# Patient Record
Sex: Female | Born: 1975 | Race: White | Hispanic: No | Marital: Married | Smoking: Former smoker
Health system: Southern US, Community
[De-identification: ages and names within clinical notes are randomized; demographics above are authoritative.]

## PROBLEM LIST (undated history)

## (undated) DIAGNOSIS — K219 Gastro-esophageal reflux disease without esophagitis: Secondary | ICD-10-CM

## (undated) DIAGNOSIS — F909 Attention-deficit hyperactivity disorder, unspecified type: Secondary | ICD-10-CM

## (undated) DIAGNOSIS — F319 Bipolar disorder, unspecified: Secondary | ICD-10-CM

## (undated) HISTORY — PX: EYE SURGERY: SHX253

---

## 2013-11-07 ENCOUNTER — Emergency Department (HOSPITAL_COMMUNITY)
Admission: EM | Admit: 2013-11-07 | Discharge: 2013-11-07 | Disposition: A | Discharge status: Home / Self Care | Payer: Self-pay | Attending: Emergency Medicine | Admitting: Emergency Medicine

## 2013-11-07 ENCOUNTER — Encounter (HOSPITAL_COMMUNITY): Payer: Self-pay | Admitting: Emergency Medicine

## 2013-11-07 DIAGNOSIS — F909 Attention-deficit hyperactivity disorder, unspecified type: Secondary | ICD-10-CM | POA: Insufficient documentation

## 2013-11-07 DIAGNOSIS — Z79899 Other long term (current) drug therapy: Secondary | ICD-10-CM | POA: Insufficient documentation

## 2013-11-07 DIAGNOSIS — Y9389 Activity, other specified: Secondary | ICD-10-CM | POA: Insufficient documentation

## 2013-11-07 DIAGNOSIS — Y929 Unspecified place or not applicable: Secondary | ICD-10-CM | POA: Insufficient documentation

## 2013-11-07 DIAGNOSIS — F319 Bipolar disorder, unspecified: Secondary | ICD-10-CM | POA: Insufficient documentation

## 2013-11-07 DIAGNOSIS — S61419A Laceration without foreign body of unspecified hand, initial encounter: Secondary | ICD-10-CM

## 2013-11-07 DIAGNOSIS — W260XXA Contact with knife, initial encounter: Secondary | ICD-10-CM | POA: Insufficient documentation

## 2013-11-07 DIAGNOSIS — S61209A Unspecified open wound of unspecified finger without damage to nail, initial encounter: Secondary | ICD-10-CM | POA: Insufficient documentation

## 2013-11-07 DIAGNOSIS — W261XXA Contact with sword or dagger, initial encounter: Secondary | ICD-10-CM

## 2013-11-07 HISTORY — DX: Attention-deficit hyperactivity disorder, unspecified type: F90.9

## 2013-11-07 HISTORY — DX: Bipolar disorder, unspecified: F31.9

## 2013-11-07 NOTE — ED Provider Notes (Signed)
CSN: [id number]     Arrival date & time 11/07/13  1028 History  This chart was scribed for non-physician practitioner, Emilia Beck, PA-C working with Gavin Pound. Oletta Lamas, MD by Greggory Stallion, ED scribe. This patient was seen in room TR07C/TR07C and the patient's care was started at 11:46 AM.    Chief Complaint  Patient presents with  . Extremity Laceration   The history is provided by the patient. No language interpreter was used.   HPI Comments: Crystal Gordon is a 38 y.o. female who presents to the Emergency Department complaining of a laceration to her right index finger that occurred about two hours ago. Pt was washing a glass when it broke in her hand. Her last tetanus was about 3 years ago.   Past Medical History  Diagnosis Date  . Bipolar 1 disorder   . ADHD (attention deficit hyperactivity disorder)    History reviewed. No pertinent past surgical history. No family history on file. History  Substance Use Topics  . Smoking status: Never Smoker   . Smokeless tobacco: Not on file  . Alcohol Use: No   OB History   Grav Para Term Preterm Abortions TAB SAB Ect Mult Living                 Review of Systems  Skin: Positive for wound.  All other systems reviewed and are negative.   Allergies  Review of patient's allergies indicates no known allergies.  Home Medications   Current Outpatient Rx  Name  Route  Sig  Dispense  Refill  . diphenhydrAMINE (BENADRYL) 25 mg capsule   Oral   Take 25 mg by mouth at bedtime as needed for allergies or sleep.         . methylphenidate (RITALIN) 5 MG tablet   Oral   Take 5 mg by mouth 2 (two) times daily.         . ziprasidone (GEODON) 20 MG capsule   Oral   Take 20 mg by mouth 2 (two) times daily with a meal.          BP 127/72  Pulse 83  Temp(Src) 98.1 F (36.7 C) (Oral)  SpO2 100%  Physical Exam  Nursing note and vitals reviewed. Constitutional: She is oriented to person, place, and time. She appears  well-developed and well-nourished. No distress.  HENT:  Head: Normocephalic and atraumatic.  Eyes: EOM are normal.  Neck: Neck supple. No tracheal deviation present.  Cardiovascular: Normal rate.   Sufficient capillary refill of distal extremities.   Pulmonary/Chest: Effort normal. No respiratory distress.  Musculoskeletal: Normal range of motion.  Limited ROM of right index finger due to pain. No obvious deformity.   Neurological: She is alert and oriented to person, place, and time.  No numbness distal to laceration.   Skin: Skin is warm and dry.  2.5 cm v-shaped laceration to right index finger. Bleeding controlled.   Psychiatric: She has a normal mood and affect. Her behavior is normal.    ED Course  Procedures (including critical care time)  DIAGNOSTIC STUDIES: Oxygen Saturation is 100% on RA, normal by my interpretation.    COORDINATION OF CARE: 11:50 AM-Discussed treatment plan which includes laceration repair with pt at bedside and pt agreed to plan.   LACERATION REPAIR PROCEDURE NOTE The patient's identification was confirmed and consent was obtained. This procedure was performed by Emilia Beck, PA-C at 11:50 AM. Site: right index finger Sterile procedures observed Anesthetic used (type and amt): 2 mL  2% lidocaine without epi Suture type/size: 4-0 prolene Length: 2.5 cm, v-shaped # of Sutures: 3 Technique: simple interrupted Complexity: simple Antibx ointment applied Tetanus UTD Site anesthetized, irrigated with NS, explored without evidence of foreign body, wound well approximated, site covered with dry, sterile dressing.  Patient tolerated procedure well without complications. Instructions for care discussed verbally and patient provided with additional written instructions for homecare and f/u.  Labs Review Labs Reviewed - No data to display Imaging Review No results found.  EKG Interpretation   None       MDM   Final diagnoses:  Hand  laceration    12:04 PM Laceration repaired without difficulty. No neurovascular compromise. Patient instructed to return in 10 days for suture removal.   I personally performed the services described in this documentation, which was scribed in my presence. The recorded information has been reviewed and is accurate.   Emilia BeckKaitlyn Alexina Niccoli, PA-C 11/07/13 1206

## 2013-11-07 NOTE — Discharge Instructions (Signed)
Keep wound clean. Return to the ED in 10 days for suture removal. Refer to attached documents for more information.  °

## 2013-11-07 NOTE — ED Notes (Signed)
Unable to locate pt x1

## 2013-11-07 NOTE — ED Notes (Signed)
Laceration to right index finger from broken drinking glass which pt was washing. 1 in. Lac , bleeding controlled.

## 2013-11-11 NOTE — ED Provider Notes (Signed)
Medical screening examination/treatment/procedure(s) were performed by non-physician practitioner and as supervising physician I was immediately available for consultation/collaboration.   Gavin PoundMichael Y. Travor Royce, MD 11/11/13 1525

## 2015-01-09 ENCOUNTER — Encounter (HOSPITAL_COMMUNITY): Payer: Self-pay | Admitting: Emergency Medicine

## 2015-01-09 ENCOUNTER — Emergency Department (INDEPENDENT_AMBULATORY_CARE_PROVIDER_SITE_OTHER)
Admission: EM | Admit: 2015-01-09 | Discharge: 2015-01-09 | Disposition: A | Discharge status: Home / Self Care | Payer: Self-pay | Source: Home / Self Care | Attending: Emergency Medicine | Admitting: Emergency Medicine

## 2015-01-09 DIAGNOSIS — R3915 Urgency of urination: Secondary | ICD-10-CM

## 2015-01-09 DIAGNOSIS — R3129 Other microscopic hematuria: Secondary | ICD-10-CM

## 2015-01-09 DIAGNOSIS — R312 Other microscopic hematuria: Secondary | ICD-10-CM

## 2015-01-09 LAB — POCT URINALYSIS DIP (DEVICE)
Bilirubin Urine: NEGATIVE
Glucose, UA: NEGATIVE mg/dL
Ketones, ur: NEGATIVE mg/dL
LEUKOCYTES UA: NEGATIVE
NITRITE: NEGATIVE
Protein, ur: NEGATIVE mg/dL
Specific Gravity, Urine: >=1.03 (ref 1.005–1.030)
Urobilinogen, UA: 0.2 mg/dL (ref 0.0–1.0)
pH: 6 (ref 5.0–8.0)

## 2015-01-09 LAB — POCT PREGNANCY, URINE: Preg Test, Ur: NEGATIVE

## 2015-01-09 MED ORDER — TAMSULOSIN HCL 0.4 MG PO CAPS: 0.4000 mg | ORAL_CAPSULE | Freq: Every day | ORAL | Status: DC

## 2015-01-09 NOTE — ED Provider Notes (Signed)
CSN: [id number]     Arrival date & time 01/09/15  1332 History   First MD Initiated Contact with Patient 01/09/15 1522     Chief Complaint  Patient presents with  . Urinary Tract Infection   (Consider location/radiation/quality/duration/timing/severity/associated sxs/prior Treatment) HPI She is a 39 year old woman here for evaluation of urinary symptoms. She states for the last year she has had intermittent right side pain. This typically occurs first thing in the morning with urination. She also reports a pressure sensation when urinating. She denies frank dysuria. She reports some urgency and frequency. No gross hematuria. No fevers or chills. No nausea. No flank pain. She also reports feeling tired all the time, but thinks this may be related to her psych medications.  Past Medical History  Diagnosis Date  . Bipolar 1 disorder   . ADHD (attention deficit hyperactivity disorder)    History reviewed. No pertinent past surgical history. History reviewed. No pertinent family history. History  Substance Use Topics  . Smoking status: Never Smoker   . Smokeless tobacco: Not on file  . Alcohol Use: No   OB History    No data available     Review of Systems  Constitutional: Negative for fever.  Gastrointestinal: Positive for abdominal pain (right side).  Genitourinary: Positive for urgency and frequency. Negative for dysuria, flank pain and vaginal bleeding.    Allergies  Review of patient's allergies indicates no known allergies.  Home Medications   Prior to Admission medications   Medication Sig Start Date End Date Taking? Authorizing Provider  citalopram (CELEXA) 20 MG tablet Take 20 mg by mouth daily.   Yes Historical Provider, MD  Lurasidone HCl (LATUDA PO) Take by mouth.   Yes Historical Provider, MD  diphenhydrAMINE (BENADRYL) 25 mg capsule Take 25 mg by mouth at bedtime as needed for allergies or sleep.    Historical Provider, MD  methylphenidate (RITALIN) 5 MG tablet  Take 5 mg by mouth 2 (two) times daily.    Historical Provider, MD  tamsulosin (FLOMAX) 0.4 MG CAPS capsule Take 1 capsule (0.4 mg total) by mouth daily. 01/09/15   Charm RingsErin J Kerolos Nehme, MD  ziprasidone (GEODON) 20 MG capsule Take 20 mg by mouth 2 (two) times daily with a meal.    Historical Provider, MD   BP 105/69 mmHg  Pulse 75  Temp(Src) 98.1 F (36.7 C) (Oral)  Resp 16  SpO2 97%  LMP 12/18/2014 Physical Exam  Constitutional: She is oriented to person, place, and time. She appears well-developed and well-nourished. No distress.  Neck: Neck supple.  Cardiovascular: Normal rate.   Pulmonary/Chest: Effort normal.  Abdominal: Soft. She exhibits no distension. There is no tenderness. There is no rebound and no guarding.  No CVA tenderness  Neurological: She is alert and oriented to person, place, and time.    ED Course  Procedures (including critical care time) Labs Review Labs Reviewed  POCT URINALYSIS DIP (DEVICE) - Abnormal; Notable for the following:    Hgb urine dipstick TRACE (*)    All other components within normal limits  URINE CULTURE  POCT PREGNANCY, URINE    Imaging Review No results found.   MDM   1. Urinary urgency   2. Microscopic hematuria    UA shows microscopic hematuria. This in combination with her right side pain, raises some suspicion for a kidney stone. This would be an unusual presentation, as her symptoms have been present for a year. She may also be having some bladder spasms. We'll treat  with a 2 week course of Flomax and Kegel exercises. Follow-up as needed.    Charm Rings, MD 01/09/15 [phone]

## 2015-01-09 NOTE — ED Notes (Signed)
C/o  Pressure.  Incontinence.  Urinary frequency.   Pt states feels like its been going on for the past year.  Pt has increased water and cranberry juice.  Denies fever, n/v/d.

## 2015-01-09 NOTE — Discharge Instructions (Signed)
There are no signs of infection in your urine. There is a small amount of blood in your urine. With the blood in your urine and those pains you have, I am concerned for a small kidney stone. Take Flomax daily for 2 weeks. Work on Kegel exercises for the urinary urgency. If there is no improvement in 2 weeks, please come back. If you develop severe pain, fevers, bloody urine, please go to the emergency room.

## 2015-01-11 LAB — URINE CULTURE
COLONY COUNT: NO GROWTH
Culture: NO GROWTH

## 2016-05-11 ENCOUNTER — Emergency Department
Admission: EM | Admit: 2016-05-11 | Discharge: 2016-05-11 | Disposition: A | Discharge status: Home / Self Care | Payer: Self-pay | Attending: Emergency Medicine | Admitting: Emergency Medicine

## 2016-05-11 ENCOUNTER — Encounter: Payer: Self-pay | Admitting: Emergency Medicine

## 2016-05-11 ENCOUNTER — Emergency Department: Payer: Self-pay

## 2016-05-11 DIAGNOSIS — J9811 Atelectasis: Secondary | ICD-10-CM | POA: Insufficient documentation

## 2016-05-11 DIAGNOSIS — F909 Attention-deficit hyperactivity disorder, unspecified type: Secondary | ICD-10-CM | POA: Insufficient documentation

## 2016-05-11 DIAGNOSIS — M545 Low back pain: Secondary | ICD-10-CM | POA: Insufficient documentation

## 2016-05-11 HISTORY — DX: Gastro-esophageal reflux disease without esophagitis: K21.9

## 2016-05-11 LAB — CBC WITH DIFFERENTIAL/PLATELET
BASOS ABS: 0.1 10*3/uL (ref 0–0.1)
Basophils Relative: 1 %
EOS PCT: 3 %
Eosinophils Absolute: 0.7 10*3/uL (ref 0–0.7)
HEMATOCRIT: 33.4 % — AB (ref 35.0–47.0)
Hemoglobin: 10.8 g/dL — ABNORMAL LOW (ref 12.0–16.0)
Lymphocytes Relative: 9 %
Lymphs Abs: 2 10*3/uL (ref 1.0–3.6)
MCH: 23.5 pg — ABNORMAL LOW (ref 26.0–34.0)
MCHC: 32.4 g/dL (ref 32.0–36.0)
MCV: 72.4 fL — AB (ref 80.0–100.0)
Monocytes Absolute: 2 10*3/uL — ABNORMAL HIGH (ref 0.2–0.9)
Monocytes Relative: 9 %
NEUTROS ABS: 16.4 10*3/uL — AB (ref 1.4–6.5)
Neutrophils Relative %: 78 %
PLATELETS: 318 10*3/uL (ref 150–440)
RBC: 4.61 MIL/uL (ref 3.80–5.20)
RDW: 16.5 % — ABNORMAL HIGH (ref 11.5–14.5)
WBC: 21.1 10*3/uL — AB (ref 3.6–11.0)

## 2016-05-11 MED ORDER — CEFTRIAXONE SODIUM 250 MG IJ SOLR
250.0000 mg | Freq: Once | INTRAMUSCULAR | Status: AC
  Administered 2016-05-11: 250 mg via INTRAMUSCULAR
  Filled 2016-05-11: qty 250

## 2016-05-11 MED ORDER — IBUPROFEN 600 MG PO TABS
ORAL_TABLET | ORAL | Status: AC
  Filled 2016-05-11: qty 1

## 2016-05-11 MED ORDER — LEVOFLOXACIN 500 MG PO TABS: 500.0000 mg | ORAL_TABLET | Freq: Every day | ORAL | 0 refills | Status: DC

## 2016-05-11 MED ORDER — IBUPROFEN 600 MG PO TABS
600.0000 mg | ORAL_TABLET | Freq: Once | ORAL | Status: AC
  Administered 2016-05-11: 600 mg via ORAL

## 2016-05-11 MED ORDER — GUAIFENESIN-CODEINE 100-10 MG/5ML PO SOLN: 10.0000 mL | ORAL | 0 refills | Status: DC | PRN

## 2016-05-11 MED ORDER — ACETAMINOPHEN 500 MG PO TABS
ORAL_TABLET | ORAL | Status: AC
  Filled 2016-05-11: qty 2

## 2016-05-11 MED ORDER — ACETAMINOPHEN 500 MG PO TABS
1000.0000 mg | ORAL_TABLET | Freq: Once | ORAL | Status: AC
  Administered 2016-05-11: 1000 mg via ORAL

## 2016-05-11 NOTE — ED Notes (Addendum)
Pt returned to the desk after D/C and stated to PA that she felt bad and requested a shot due to how bad she felt. This RN undid D/C and put patient back in room.

## 2016-05-11 NOTE — ED Notes (Signed)
PA made aware of pt's vitals. Per PA pt okay to D/C.

## 2016-05-11 NOTE — ED Triage Notes (Signed)
Cough, congestion x 2 weeks - no pcp. No meds - took nyquil last night

## 2016-05-11 NOTE — ED Provider Notes (Addendum)
[facility] Emergency Department Provider Note  ____________________________________________  Time seen: Approximately 12:43 PM  I have reviewed the triage vital signs and the nursing notes.   HISTORY  Chief Complaint Cough    HPI Arturo MortonHolly Demmer is a 40 y.o. female presents for evaluation of cough congestion 2 weeks. Patient states that she's been running fever on and off no relief with over-the-counter medications such as NyQuil or Robitussin. Patient states no nasal congestion.   Past Medical History:  Diagnosis Date  . ADHD (attention deficit hyperactivity disorder)   . Bipolar 1 disorder (HCC)   . GERD (gastroesophageal reflux disease)     There are no active problems to display for this patient.   History reviewed. No pertinent surgical history.  Prior to Admission medications   Medication Sig Start Date End Date Taking? Authorizing Provider  citalopram (CELEXA) 20 MG tablet Take 20 mg by mouth daily.    Historical Provider, MD  diphenhydrAMINE (BENADRYL) 25 mg capsule Take 25 mg by mouth at bedtime as needed for allergies or sleep.    Historical Provider, MD  guaiFENesin-codeine 100-10 MG/5ML syrup Take 10 mLs by mouth every 4 (four) hours as needed for cough. 05/11/16   Charmayne Sheerharles M Treyon Wymore, PA-C  levofloxacin (LEVAQUIN) 500 MG tablet Take 1 tablet (500 mg total) by mouth daily. 05/11/16   Charmayne Sheerharles M Gerome Kokesh, PA-C  Lurasidone HCl (LATUDA PO) Take by mouth.    Historical Provider, MD  methylphenidate (RITALIN) 5 MG tablet Take 5 mg by mouth 2 (two) times daily.    Historical Provider, MD  tamsulosin (FLOMAX) 0.4 MG CAPS capsule Take 1 capsule (0.4 mg total) by mouth daily. 01/09/15   Charm RingsErin J Honig, MD  ziprasidone (GEODON) 20 MG capsule Take 20 mg by mouth 2 (two) times daily with a meal.    Historical Provider, MD    Allergies Review of patient's allergies indicates no known allergies.  History reviewed. No pertinent family history.  Social  History Social History  Substance Use Topics  . Smoking status: Never Smoker  . Smokeless tobacco: Never Used  . Alcohol use No    Review of Systems Constitutional: No fever/chills ENT: No sore throat. Cardiovascular: Denies chest pain. Respiratory: Denies shortness of breath.Positive for coughing. Musculoskeletal: Positive for low back pain secondary to coughing. Skin: Negative for rash. Neurological: Negative for headaches, focal weakness or numbness.  10-point ROS otherwise negative.  ____________________________________________   PHYSICAL EXAM:  VITAL SIGNS: ED Triage Vitals  Enc Vitals Group     BP 05/11/16 1218 130/67     Pulse Rate 05/11/16 1218 (!) 105     Resp --      Temp 05/11/16 1218 99.2 F (37.3 C)     Temp Source 05/11/16 1218 Oral     SpO2 05/11/16 1218 99 %     Weight 05/11/16 1219 206 lb (93.4 kg)     Height 05/11/16 1219 5' 6.5" (1.689 m)     Head Circumference --      Peak Flow --      Pain Score 05/11/16 1219 7     Pain Loc --      Pain Edu? --      Excl. in GC? --     Constitutional: Alert and oriented. Well appearing and in no acute distress. Eyes: Conjunctivae are normal. PERRL. EOMI. Nose: No congestion/rhinnorhea. Mouth/Throat: Mucous membranes are moist.  Oropharynx non-erythematous.  Cardiovascular: Normal rate, regular rhythm. Grossly normal heart sounds.  Good peripheral  circulation. Respiratory: Normal respiratory effort.  No retractions. Scattered coarse breath sounds noted bilaterally. Musculoskeletal: No lower extremity tenderness nor edema.  No joint effusions. Neurologic:  Normal speech and language. No gross focal neurologic deficits are appreciated. No gait instability. Skin:  Skin is warm, dry and intact. No rash noted. Psychiatric: Mood and affect are normal. Speech and behavior are normal.  ____________________________________________   LABS (all labs ordered are listed, but only abnormal results are displayed)  Labs  Reviewed - No data to display ____________________________________________  EKG   ____________________________________________  RADIOLOGY  IMPRESSION:  Streaky right basilar atelectasis or early infiltrate. No pulmonary  edema.    ____________________________________________   PROCEDURES  Procedure(s) performed: None  Critical Care performed: No  ____________________________________________   INITIAL IMPRESSION / ASSESSMENT AND PLAN / ED COURSE  Pertinent labs & imaging results that were available during my care of the patient were reviewed by me and considered in my medical decision making (see chart for details). Review of the [place] CSRS was performed in accordance of the [facility] prior to dispensing any controlled drugs.  Atelectasis. Patient discharged home with Rx for Levaquin and Robitussin-AC. She does follow-up with her PCP or return to the ER with any worsening symptomology.  Clinical Course   Patient was given Rocephin 250 mg IM prior to discharge.  Fever while in the ED per start to decrease prior to discharge. ____________________________________________   FINAL CLINICAL IMPRESSION(S) / ED DIAGNOSES  Final diagnoses:  Atelectasis of right lung     This chart was dictated using voice recognition software/Dragon. Despite best efforts to proofread, errors can occur which can change the meaning. Any change was purely unintentional.    Evangeline Dakinharles M Lathen Seal, PA-C 05/11/16 1545    Governor Rooksebecca Lord, MD 05/11/16 1555    Charmayne Sheerharles M Lorenzo Pereyra, PA-C 05/11/16 1848    Governor Rooksebecca Lord, MD 05/12/16 1212

## 2016-05-11 NOTE — ED Notes (Signed)
Pt given written instructions from PA in regards to tylenol and ibuprofen administration at home. Pt states understanding. Denies any questions at the time of D/C.

## 2016-05-11 NOTE — ED Notes (Signed)
PA notified of patient's vitals.

## 2016-05-11 NOTE — ED Notes (Signed)
Per MD pt is okay to go with original D/C instructions at this time.

## 2016-05-29 ENCOUNTER — Emergency Department: Payer: Self-pay

## 2016-05-29 ENCOUNTER — Encounter: Payer: Self-pay | Admitting: *Deleted

## 2016-05-29 ENCOUNTER — Emergency Department
Admission: EM | Admit: 2016-05-29 | Discharge: 2016-05-29 | Disposition: A | Discharge status: Home / Self Care | Payer: Self-pay | Attending: Emergency Medicine | Admitting: Emergency Medicine

## 2016-05-29 DIAGNOSIS — R059 Cough, unspecified: Secondary | ICD-10-CM

## 2016-05-29 DIAGNOSIS — R05 Cough: Secondary | ICD-10-CM

## 2016-05-29 DIAGNOSIS — J40 Bronchitis, not specified as acute or chronic: Secondary | ICD-10-CM | POA: Insufficient documentation

## 2016-05-29 DIAGNOSIS — F909 Attention-deficit hyperactivity disorder, unspecified type: Secondary | ICD-10-CM | POA: Insufficient documentation

## 2016-05-29 MED ORDER — IPRATROPIUM-ALBUTEROL 0.5-2.5 (3) MG/3ML IN SOLN
3.0000 mL | Freq: Once | RESPIRATORY_TRACT | Status: AC
  Administered 2016-05-29: 3 mL via RESPIRATORY_TRACT
  Filled 2016-05-29: qty 3

## 2016-05-29 MED ORDER — GUAIFENESIN-CODEINE 100-10 MG/5ML PO SOLN: 5.0000 mL | Freq: Three times a day (TID) | ORAL | 0 refills | Status: DC | PRN

## 2016-05-29 MED ORDER — ALBUTEROL SULFATE HFA 108 (90 BASE) MCG/ACT IN AERS: 2.0000 | INHALATION_SPRAY | Freq: Four times a day (QID) | RESPIRATORY_TRACT | 0 refills | Status: DC | PRN

## 2016-05-29 MED ORDER — PREDNISONE 10 MG PO TABS: 10.0000 mg | ORAL_TABLET | Freq: Two times a day (BID) | ORAL | 0 refills | Status: DC

## 2016-05-29 MED ORDER — BENZONATATE 100 MG PO CAPS: 100.0000 mg | ORAL_CAPSULE | Freq: Three times a day (TID) | ORAL | 0 refills | Status: DC | PRN

## 2016-05-29 MED ORDER — FLUTICASONE PROPIONATE 50 MCG/ACT NA SUSP: 2.0000 | Freq: Every day | NASAL | 0 refills | Status: DC

## 2016-05-29 NOTE — Discharge Instructions (Signed)
Take the prescription meds as directed. Follow-up with one of the community clinics as needed.  ?

## 2016-05-29 NOTE — ED Triage Notes (Signed)
Pt reports a cough, chills, runny nose for 2 days.  Pt had recent pneumonia.   Nonsmonker.  No fever   Pt alert.

## 2016-05-29 NOTE — ED Provider Notes (Signed)
[facility] Emergency Department Provider Note ____________________________________________  Time seen: 2158  I have reviewed the triage vital signs and the nursing notes.  HISTORY  Chief Complaint  Cough  HPI Crystal Gordon is a 40 y.o. female presents to the ED for evaluation management of a 2 day complaint of mouth cough. Patient was evaluated here on 8/20 and diagnosed with a community-acquired pneumonia. She was treated with a Z-Pak and reports resolution of symptoms. She is been well until about 2 days prior when she began to develop some intermittent chills and a dry cough. She is also noticed some sneezing in the interim. She denies any fevers, sweats, or chest wall pain. She also denies any shortness of breath or wheezing. She dosed Mucinex today and until recently had been dosing her closely prescribed Robitussin-AC syrup.  Past Medical History:  Diagnosis Date  . ADHD (attention deficit hyperactivity disorder)   . Bipolar 1 disorder (HCC)   . GERD (gastroesophageal reflux disease)     There are no active problems to display for this patient.   History reviewed. No pertinent surgical history.  Prior to Admission medications   Medication Sig Start Date End Date Taking? Authorizing Provider  albuterol (PROVENTIL HFA;VENTOLIN HFA) 108 (90 Base) MCG/ACT inhaler Inhale 2 puffs into the lungs every 6 (six) hours as needed for wheezing or shortness of breath. 05/29/16   Jamieson Hetland V Bacon Breydon Senters, PA-C  benzonatate (TESSALON PERLES) 100 MG capsule Take 1 capsule (100 mg total) by mouth 3 (three) times daily as needed for cough (Take 1-2 per dose). 05/29/16   Ronalee Scheunemann V Bacon Tai Syfert, PA-C  citalopram (CELEXA) 20 MG tablet Take 20 mg by mouth daily.    Historical Provider, MD  diphenhydrAMINE (BENADRYL) 25 mg capsule Take 25 mg by mouth at bedtime as needed for allergies or sleep.    Historical Provider, MD  fluticasone (FLONASE) 50 MCG/ACT nasal spray Place 2 sprays  into both nostrils daily. 05/29/16   Assata Juncaj V Bacon Sharran Caratachea, PA-C  guaiFENesin-codeine 100-10 MG/5ML syrup Take 5 mLs by mouth 3 (three) times daily as needed for cough. 05/29/16   Rosalynn Sergent V Bacon Avid Guillette, PA-C  levofloxacin (LEVAQUIN) 500 MG tablet Take 1 tablet (500 mg total) by mouth daily. 05/11/16   Charmayne Sheerharles M Beers, PA-C  Lurasidone HCl (LATUDA PO) Take by mouth.    Historical Provider, MD  methylphenidate (RITALIN) 5 MG tablet Take 5 mg by mouth 2 (two) times daily.    Historical Provider, MD  predniSONE (DELTASONE) 10 MG tablet Take 1 tablet (10 mg total) by mouth 2 (two) times daily with a meal. 05/29/16   Dagan Heinz V Bacon Masha Orbach, PA-C  tamsulosin (FLOMAX) 0.4 MG CAPS capsule Take 1 capsule (0.4 mg total) by mouth daily. 01/09/15   Charm RingsErin J Honig, MD  ziprasidone (GEODON) 20 MG capsule Take 20 mg by mouth 2 (two) times daily with a meal.    Historical Provider, MD   Allergies Review of patient's allergies indicates no known allergies.  No family history on file.  Social History Social History  Substance Use Topics  . Smoking status: Never Smoker  . Smokeless tobacco: Never Used  . Alcohol use No   Review of Systems  Constitutional: Negative for fever. Reports chills. Eyes: Negative for visual changes. ENT: Negative for sore throat. Reports sneezing Cardiovascular: Negative for chest pain. Respiratory: Negative for shortness of breath. Reports non-productive cough Gastrointestinal: Negative for abdominal pain, vomiting and diarrhea. Musculoskeletal: Negative for back pain. ____________________________________________  PHYSICAL EXAM:  VITAL SIGNS: ED Triage Vitals [05/29/16 2112]  Enc Vitals Group     BP (!) 118/54     Pulse Rate 97     Resp 18     Temp 98.2 F (36.8 C)     Temp Source Oral     SpO2 98 %     Weight 205 lb (93 kg)     Height 5\' 6"  (1.676 m)     Head Circumference      Peak Flow      Pain Score 7     Pain Loc      Pain Edu?      Excl. in GC?      Constitutional: Alert and oriented. Well appearing and in no distress. Head: Normocephalic and atraumatic. Eyes: Conjunctivae are normal. PERRL. Normal extraocular movements Ears: Canals clear. TMs intact bilaterally. Nose: No congestion/rhinorrhea. Mouth/Throat: Mucous membranes are moist. Neck: Supple. No thyromegaly. Hematological/Lymphatic/Immunological: No cervical lymphadenopathy. Cardiovascular: Normal rate, regular rhythm.  Respiratory: Normal respiratory effort. No wheezes/rales/rhonchi. Gastrointestinal: Soft and nontender. No distention. No CVA tenderness ____________________________________________   RADIOLOGY  CXR IMPRESSION: No active cardiopulmonary disease. ____________________________________________  PROCEDURES  DuoNeb x 1 ____________________________________________  INITIAL IMPRESSION / ASSESSMENT AND PLAN / ED COURSE  Patient with an acute bronchospasm without radiologic evidence of a pneumonia. She'll be discharged with multiple prescriptions including Flonase, albuterol, Tessalon Perles, codeine guaifenesin syrup, and prednisone. She will follow-up with her primary care provider or one of the local clinics for ongoing symptom management.  Clinical Course   ____________________________________________  FINAL CLINICAL IMPRESSION(S) / ED DIAGNOSES  Final diagnoses:  Cough  Bronchitis      Lissa Hoard, PA-C 05/29/16 2253    Emily Filbert, MD 05/29/16 2259

## 2017-03-10 ENCOUNTER — Emergency Department: Payer: Medicaid Other

## 2017-03-10 ENCOUNTER — Inpatient Hospital Stay
Admission: EM | Admit: 2017-03-10 | Discharge: 2017-03-13 | DRG: 872 | Disposition: A | Discharge status: Home / Self Care | Payer: Medicaid Other | Attending: Internal Medicine | Admitting: Internal Medicine

## 2017-03-10 ENCOUNTER — Encounter: Payer: Self-pay | Admitting: Emergency Medicine

## 2017-03-10 DIAGNOSIS — F319 Bipolar disorder, unspecified: Secondary | ICD-10-CM | POA: Diagnosis present

## 2017-03-10 DIAGNOSIS — D509 Iron deficiency anemia, unspecified: Secondary | ICD-10-CM | POA: Diagnosis present

## 2017-03-10 DIAGNOSIS — R109 Unspecified abdominal pain: Secondary | ICD-10-CM

## 2017-03-10 DIAGNOSIS — K5100 Ulcerative (chronic) pancolitis without complications: Secondary | ICD-10-CM | POA: Diagnosis present

## 2017-03-10 DIAGNOSIS — R0682 Tachypnea, not elsewhere classified: Secondary | ICD-10-CM | POA: Diagnosis present

## 2017-03-10 DIAGNOSIS — Z87891 Personal history of nicotine dependence: Secondary | ICD-10-CM

## 2017-03-10 DIAGNOSIS — F909 Attention-deficit hyperactivity disorder, unspecified type: Secondary | ICD-10-CM | POA: Diagnosis present

## 2017-03-10 DIAGNOSIS — K219 Gastro-esophageal reflux disease without esophagitis: Secondary | ICD-10-CM | POA: Diagnosis present

## 2017-03-10 DIAGNOSIS — A020 Salmonella enteritis: Secondary | ICD-10-CM | POA: Diagnosis present

## 2017-03-10 DIAGNOSIS — B9620 Unspecified Escherichia coli [E. coli] as the cause of diseases classified elsewhere: Secondary | ICD-10-CM | POA: Diagnosis present

## 2017-03-10 DIAGNOSIS — Z79899 Other long term (current) drug therapy: Secondary | ICD-10-CM

## 2017-03-10 DIAGNOSIS — A419 Sepsis, unspecified organism: Principal | ICD-10-CM | POA: Diagnosis present

## 2017-03-10 DIAGNOSIS — E669 Obesity, unspecified: Secondary | ICD-10-CM | POA: Diagnosis present

## 2017-03-10 DIAGNOSIS — R000 Tachycardia, unspecified: Secondary | ICD-10-CM | POA: Diagnosis present

## 2017-03-10 LAB — CBC WITH DIFFERENTIAL/PLATELET
Basophils Absolute: 0.1 10*3/uL (ref 0–0.1)
Basophils Relative: 1 %
Eosinophils Absolute: 0 10*3/uL (ref 0–0.7)
Eosinophils Relative: 0 %
HEMATOCRIT: 33.5 % — AB (ref 35.0–47.0)
HEMOGLOBIN: 10.5 g/dL — AB (ref 12.0–16.0)
LYMPHS ABS: 0.9 10*3/uL — AB (ref 1.0–3.6)
LYMPHS PCT: 9 %
MCH: 21.1 pg — ABNORMAL LOW (ref 26.0–34.0)
MCHC: 31.4 g/dL — ABNORMAL LOW (ref 32.0–36.0)
MCV: 67.3 fL — AB (ref 80.0–100.0)
Monocytes Absolute: 0.9 10*3/uL (ref 0.2–0.9)
Monocytes Relative: 9 %
NEUTROS ABS: 8.5 10*3/uL — AB (ref 1.4–6.5)
NEUTROS PCT: 81 %
Platelets: 321 10*3/uL (ref 150–440)
RBC: 4.97 MIL/uL (ref 3.80–5.20)
RDW: 19.2 % — ABNORMAL HIGH (ref 11.5–14.5)
WBC: 10.3 10*3/uL (ref 3.6–11.0)

## 2017-03-10 LAB — COMPREHENSIVE METABOLIC PANEL
ALT: 13 U/L — AB (ref 14–54)
AST: 36 U/L (ref 15–41)
Albumin: 3.5 g/dL (ref 3.5–5.0)
Alkaline Phosphatase: 73 U/L (ref 38–126)
Anion gap: 10 (ref 5–15)
BUN: 8 mg/dL (ref 6–20)
CHLORIDE: 98 mmol/L — AB (ref 101–111)
CO2: 26 mmol/L (ref 22–32)
Calcium: 10.3 mg/dL (ref 8.9–10.3)
Creatinine, Ser: 0.92 mg/dL (ref 0.44–1.00)
GFR calc Af Amer: >60 mL/min (ref >60)
GFR calc non Af Amer: >60 mL/min (ref >60)
Glucose, Bld: 170 mg/dL — ABNORMAL HIGH (ref 65–99)
Potassium: 3.6 mmol/L (ref 3.5–5.1)
SODIUM: 134 mmol/L — AB (ref 135–145)
Total Bilirubin: 0.4 mg/dL (ref 0.3–1.2)
Total Protein: 7.3 g/dL (ref 6.5–8.1)

## 2017-03-10 LAB — URINALYSIS, COMPLETE (UACMP) WITH MICROSCOPIC
Bilirubin Urine: NEGATIVE
Glucose, UA: NEGATIVE mg/dL
KETONES UR: NEGATIVE mg/dL
Leukocytes, UA: NEGATIVE
Nitrite: NEGATIVE
PROTEIN: 30 mg/dL — AB
Specific Gravity, Urine: 1.02 (ref 1.005–1.030)
pH: 5.5 (ref 5.0–8.0)

## 2017-03-10 LAB — POCT PREGNANCY, URINE: Preg Test, Ur: NEGATIVE

## 2017-03-10 LAB — LACTIC ACID, PLASMA: Lactic Acid, Venous: 3.2 mmol/L (ref 0.5–1.9)

## 2017-03-10 MED ORDER — SODIUM CHLORIDE 0.9 % IV BOLUS (SEPSIS)
1000.0000 mL | Freq: Once | INTRAVENOUS | Status: AC
  Administered 2017-03-11: 1000 mL via INTRAVENOUS

## 2017-03-10 MED ORDER — IOPAMIDOL (ISOVUE-300) INJECTION 61%
100.0000 mL | Freq: Once | INTRAVENOUS | Status: AC | PRN
  Administered 2017-03-10: 100 mL via INTRAVENOUS

## 2017-03-10 MED ORDER — SODIUM CHLORIDE 0.9 % IV BOLUS (SEPSIS)
1000.0000 mL | Freq: Once | INTRAVENOUS | Status: AC
  Administered 2017-03-10: 1000 mL via INTRAVENOUS

## 2017-03-10 MED ORDER — PIPERACILLIN-TAZOBACTAM 3.375 G IVPB 30 MIN
3.3750 g | Freq: Once | INTRAVENOUS | Status: AC
  Administered 2017-03-10: 3.375 g via INTRAVENOUS
  Filled 2017-03-10: qty 50

## 2017-03-10 MED ORDER — VANCOMYCIN HCL IN DEXTROSE 1-5 GM/200ML-% IV SOLN
1000.0000 mg | Freq: Once | INTRAVENOUS | Status: AC
  Administered 2017-03-10: 1000 mg via INTRAVENOUS
  Filled 2017-03-10: qty 200

## 2017-03-10 NOTE — ED Provider Notes (Signed)
[facility] Emergency Department Provider Note   First MD Initiated Contact with Patient 03/10/17 2310     (approximate)  I have reviewed the triage vital signs and the nursing notes.   HISTORY  Chief Complaint Generalized Body Aches; Diarrhea; and Nausea    HPI Crystal Gordon is a 41 y.o. female with below list of chronic medical conditions presents with fever generalized body aches diarrhea and nausea 3 days. Patient states that she's had previous episodes of diarrhea and fatigue most recent of which last month. On arrival patient febrile to 100.4 heart rate 117. Patient does admit to abdominal cramping in the bilateral lower quadrants current pain score is 6 out of 10. Patient denies any cough no urinary symptoms. Patient denies any vomiting but does admit to nausea.   Past Medical History:  Diagnosis Date  . ADHD (attention deficit hyperactivity disorder)   . Bipolar 1 disorder (HCC)   . GERD (gastroesophageal reflux disease)     There are no active problems to display for this patient.   Past Surgical History:  Procedure Laterality Date  . EYE SURGERY      Prior to Admission medications   Medication Sig Start Date End Date Taking? Authorizing Provider  omeprazole (PRILOSEC) 20 MG capsule Take 20 mg by mouth daily.   Yes [provider]    Allergies No known drug allergies No family history on file.  Social History Social History  Substance Use Topics  . Smoking status: Former Games developer  . Smokeless tobacco: Never Used  . Alcohol use Yes     Comment: rarely    Review of Systems Constitutional: Positive for fever/chills Eyes: No visual changes. ENT: No sore throat. Cardiovascular: Denies chest pain. Respiratory: Denies shortness of breath. Gastrointestinal: Positive for abdominal pain nausea and diarrhea Genitourinary: Negative for dysuria. Musculoskeletal: Negative for neck pain.  Negative for back pain. Integumentary:  Negative for rash. Neurological: Negative for headaches, focal weakness or numbness.   ____________________________________________   PHYSICAL EXAM:  VITAL SIGNS: ED Triage Vitals  Enc Vitals Group     BP 03/10/17 2132 (!) 141/64     Pulse Rate 03/10/17 2132 (!) 117     Resp 03/10/17 2132 20     Temp 03/10/17 2132 (!) 100.4 F (38 C)     Temp Source 03/10/17 2132 Oral     SpO2 03/10/17 2132 97 %     Weight 03/10/17 2133 95.3 kg (210 lb)     Height 03/10/17 2133 1.689 m (5' 6.5")     Head Circumference --      Peak Flow --      Pain Score 03/10/17 2134 3     Pain Loc --      Pain Edu? --      Excl. in GC? --     Constitutional: Alert and oriented. Well appearing and in no acute distress. Eyes: Conjunctivae are normal.  Head: Atraumatic. Mouth/Throat: Mucous membranes are moist.  Oropharynx non-erythematous. Neck: No stridor. Cardiovascular: Tachycardic , regular rhythm. Good peripheral circulation. Grossly normal heart sounds. Respiratory: Normal respiratory effort.  No retractions. Lungs CTAB. Gastrointestinal: Left lower quadrant/right lower quadrant tenderness to palpation.. No distention.  Musculoskeletal: No lower extremity tenderness nor edema. No gross deformities of extremities. Neurologic:  Normal speech and language. No gross focal neurologic deficits are appreciated.  Skin:  Skin is warm, dry and intact. No rash noted. Psychiatric: Mood and affect are normal. Speech and behavior are normal.  ____________________________________________  LABS (all labs ordered are listed, but only abnormal results are displayed)  Labs Reviewed  COMPREHENSIVE METABOLIC PANEL - Abnormal; Notable for the following:       Result Value   Sodium 134 (*)    Chloride 98 (*)    Glucose, Bld 170 (*)    ALT 13 (*)    All other components within normal limits  LACTIC ACID, PLASMA - Abnormal; Notable for the following:    Lactic Acid, Venous 3.2 (*)    All other components within  normal limits  CBC WITH DIFFERENTIAL/PLATELET - Abnormal; Notable for the following:    Hemoglobin 10.5 (*)    HCT 33.5 (*)    MCV 67.3 (*)    MCH 21.1 (*)    MCHC 31.4 (*)    RDW 19.2 (*)    Neutro Abs 8.5 (*)    Lymphs Abs 0.9 (*)    All other components within normal limits  URINALYSIS, COMPLETE (UACMP) WITH MICROSCOPIC - Abnormal; Notable for the following:    Hgb urine dipstick MODERATE (*)    Protein, ur 30 (*)    Squamous Epithelial / LPF 0-5 (*)    Bacteria, UA RARE (*)    All other components within normal limits  C DIFFICILE QUICK SCREEN W PCR REFLEX  CULTURE, BLOOD (ROUTINE X 2)  CULTURE, BLOOD (ROUTINE X 2)  GASTROINTESTINAL PANEL BY PCR, STOOL (REPLACES STOOL CULTURE)  LACTIC ACID, PLASMA  POCT PREGNANCY, URINE   ____________________________________________  EKG  ED ECG REPORT I, [place] Gaylin Bulthuis, the attending physician, personally viewed and interpreted this ECG.   Date: 03/11/2017  EKG Time: 12:03 AM  Rate: 97  Rhythm: Normal sinus rhythm  Axis: Normal  Intervals: Normal  ST&T Change: None  ____________________________________________  RADIOLOGY I, [place] N Zadin Lange, personally viewed and evaluated these images (plain radiographs) as part of my medical decision making, as well as reviewing the written report by the radiologist.  Ct Abdomen Pelvis W Contrast  Result Date: 03/11/2017 CLINICAL DATA:  Fever and right lower quadrant pain. Body aches, diarrhea and nausea for 2 days. EXAM: CT ABDOMEN AND PELVIS WITH CONTRAST TECHNIQUE: Multidetector CT imaging of the abdomen and pelvis was performed using the standard protocol following bolus administration of intravenous contrast. CONTRAST:  ISOVUE-300 IOPAMIDOL (ISOVUE-300) INJECTION 61% COMPARISON:  None. FINDINGS: Lower chest: Lung bases are clear. Hepatobiliary: No focal liver abnormality is seen. No gallstones, gallbladder wall thickening, or biliary dilatation. Pancreas: No ductal dilatation or  inflammation. Spleen: Normal in size without focal abnormality. Adrenals/Urinary Tract: Adrenal glands are unremarkable. Kidneys are normal, without renal calculi, focal lesion, or hydronephrosis. Symmetric renal excretion on delayed phase imaging per Bladder is unremarkable. Stomach/Bowel: Pan colonic wall thickening and enhancement of the entire colon. Mild pericolonic soft tissue edema involves the cecum and ascending colon. Liquid stool throughout the colon consistent with diarrheal process. The appendix is upper normal in size measuring 7 mm with without surrounding inflammatory change. Small bowel is decompressed without wall thickening or inflammation. Stomach is decompressed. Small hiatal hernia. Vascular/Lymphatic: Enlarged ileocolic lymph nodes largest measuring 11 mm, likely reactive. Small central mesenteric nodes. No retroperitoneal or pelvic adenopathy. Abdominal aorta is normal in caliber. Portal vein appears patent. Reproductive: Uterus and bilateral adnexa are unremarkable. Other: No free air, free fluid, or intra-abdominal fluid collection. Musculoskeletal: There are no acute or suspicious osseous abnormalities. IMPRESSION: 1. Pancolitis, may be infectious or inflammatory. Findings are most pronounced in the cecum and ascending colon. Adjacent ileocolic  lymph nodes are likely reactive. 2. No evidence of appendicitis. Electronically Signed   By: Rubye Oaks M.D.   On: 03/11/2017 00:06   Dg Chest Port 1 View  Result Date: 03/10/2017 CLINICAL DATA:  41 year old female with fever. EXAM: PORTABLE CHEST 1 VIEW COMPARISON:  Chest radiograph dated 05/29/2016 FINDINGS: The heart size and mediastinal contours are within normal limits. Both lungs are clear. The visualized skeletal structures are unremarkable. IMPRESSION: No active disease. Electronically Signed   By: Elgie Collard M.D.   On: 03/10/2017 23:55    ____________________________________________   PROCEDURES  Critical Care  performed:  CRITICAL CARE Performed by: Darci Current   Total critical care time: 40 minutes  Critical care time was exclusive of separately billable procedures and treating other patients.  Critical care was necessary to treat or prevent imminent or life-threatening deterioration.  Critical care was time spent personally by me on the following activities: development of treatment plan with patient and/or surrogate as well as nursing, discussions with consultants, evaluation of patient's response to treatment, examination of patient, obtaining history from patient or surrogate, ordering and performing treatments and interventions, ordering and review of laboratory studies, ordering and review of radiographic studies, pulse oximetry and re-evaluation of patient's condition.   Procedures   ____________________________________________   INITIAL IMPRESSION / ASSESSMENT AND PLAN / ED COURSE  Pertinent labs & imaging results that were available during my care of the patient were reviewed by me and considered in my medical decision making (see chart for details).  41 year old female presenting with diarrhea and generalized abdominal cramping with vital signs consistent with possible sepsis. Lactic acid 3.2. Sepsis protocol initiated patient received appropriate IV fluid rehydration as well as appropriate IV antibiotics CT scan revealed pancolitis. Patient discussed with Dr. Anne Hahn for hospital admission for further evaluation and management.      ____________________________________________  FINAL CLINICAL IMPRESSION(S) / ED DIAGNOSES  Final diagnoses:  Sepsis, due to unspecified organism (HCC)  Pancolitis (HCC)     MEDICATIONS GIVEN DURING THIS VISIT:  Medications  vancomycin (VANCOCIN) 1,250 mg in sodium chloride 0.9 % 250 mL IVPB (not administered)  piperacillin-tazobactam (ZOSYN) IVPB 3.375 g (not administered)  sodium chloride 0.9 % bolus 1,000 mL (0 mLs Intravenous  Stopped 03/11/17 0048)    And  sodium chloride 0.9 % bolus 1,000 mL (0 mLs Intravenous Stopped 03/11/17 0048)    And  sodium chloride 0.9 % bolus 1,000 mL (1,000 mLs Intravenous New Bag/Given 03/11/17 0048)  piperacillin-tazobactam (ZOSYN) IVPB 3.375 g (0 g Intravenous Stopped 03/11/17 0015)  vancomycin (VANCOCIN) IVPB 1000 mg/200 mL premix (0 mg Intravenous Stopped 03/11/17 0101)  iopamidol (ISOVUE-300) 61 % injection 100 mL (100 mLs Intravenous Contrast Given 03/10/17 2340)     NEW OUTPATIENT MEDICATIONS STARTED DURING THIS VISIT:  New Prescriptions   No medications on file    Modified Medications   No medications on file    Discontinued Medications   ALBUTEROL (PROVENTIL HFA;VENTOLIN HFA) 108 (90 BASE) MCG/ACT INHALER    Inhale 2 puffs into the lungs every 6 (six) hours as needed for wheezing or shortness of breath.   BENZONATATE (TESSALON PERLES) 100 MG CAPSULE    Take 1 capsule (100 mg total) by mouth 3 (three) times daily as needed for cough (Take 1-2 per dose).   CITALOPRAM (CELEXA) 20 MG TABLET    Take 20 mg by mouth daily.   DIPHENHYDRAMINE (BENADRYL) 25 MG CAPSULE    Take 25 mg by mouth at bedtime as  needed for allergies or sleep.   FLUTICASONE (FLONASE) 50 MCG/ACT NASAL SPRAY    Place 2 sprays into both nostrils daily.   GUAIFENESIN-CODEINE 100-10 MG/5ML SYRUP    Take 5 mLs by mouth 3 (three) times daily as needed for cough.   LEVOFLOXACIN (LEVAQUIN) 500 MG TABLET    Take 1 tablet (500 mg total) by mouth daily.   LURASIDONE HCL (LATUDA PO)    Take by mouth.   METHYLPHENIDATE (RITALIN) 5 MG TABLET    Take 5 mg by mouth 2 (two) times daily.   PREDNISONE (DELTASONE) 10 MG TABLET    Take 1 tablet (10 mg total) by mouth 2 (two) times daily with a meal.   TAMSULOSIN (FLOMAX) 0.4 MG CAPS CAPSULE    Take 1 capsule (0.4 mg total) by mouth daily.   ZIPRASIDONE (GEODON) 20 MG CAPSULE    Take 20 mg by mouth 2 (two) times daily with a meal.     Note:  This document was prepared using  Dragon voice recognition software and may include unintentional dictation errors.    Darci CurrentBrown, [place] N, MD 03/11/17 [phone]

## 2017-03-10 NOTE — ED Triage Notes (Signed)
Patient states that she has had fever, body aches, diarrhea and nausea since Sunday. Patient states that her fever has been up to 102 at home. Patient states that she has taken OTC meds for the diarrhea with no improvement.

## 2017-03-11 ENCOUNTER — Inpatient Hospital Stay: Payer: Medicaid Other

## 2017-03-11 ENCOUNTER — Encounter: Payer: Self-pay | Admitting: Internal Medicine

## 2017-03-11 ENCOUNTER — Other Ambulatory Visit: Payer: Self-pay

## 2017-03-11 DIAGNOSIS — K219 Gastro-esophageal reflux disease without esophagitis: Secondary | ICD-10-CM | POA: Diagnosis present

## 2017-03-11 DIAGNOSIS — A020 Salmonella enteritis: Secondary | ICD-10-CM

## 2017-03-11 DIAGNOSIS — K5100 Ulcerative (chronic) pancolitis without complications: Secondary | ICD-10-CM

## 2017-03-11 DIAGNOSIS — R0682 Tachypnea, not elsewhere classified: Secondary | ICD-10-CM | POA: Diagnosis present

## 2017-03-11 DIAGNOSIS — B9620 Unspecified Escherichia coli [E. coli] as the cause of diseases classified elsewhere: Secondary | ICD-10-CM | POA: Diagnosis present

## 2017-03-11 DIAGNOSIS — A09 Infectious gastroenteritis and colitis, unspecified: Secondary | ICD-10-CM

## 2017-03-11 DIAGNOSIS — Z87891 Personal history of nicotine dependence: Secondary | ICD-10-CM | POA: Diagnosis not present

## 2017-03-11 DIAGNOSIS — F909 Attention-deficit hyperactivity disorder, unspecified type: Secondary | ICD-10-CM | POA: Diagnosis present

## 2017-03-11 DIAGNOSIS — A419 Sepsis, unspecified organism: Secondary | ICD-10-CM | POA: Diagnosis not present

## 2017-03-11 DIAGNOSIS — R109 Unspecified abdominal pain: Secondary | ICD-10-CM

## 2017-03-11 DIAGNOSIS — E669 Obesity, unspecified: Secondary | ICD-10-CM | POA: Diagnosis present

## 2017-03-11 DIAGNOSIS — D509 Iron deficiency anemia, unspecified: Secondary | ICD-10-CM

## 2017-03-11 DIAGNOSIS — Z79899 Other long term (current) drug therapy: Secondary | ICD-10-CM | POA: Diagnosis not present

## 2017-03-11 DIAGNOSIS — F319 Bipolar disorder, unspecified: Secondary | ICD-10-CM | POA: Diagnosis present

## 2017-03-11 DIAGNOSIS — R000 Tachycardia, unspecified: Secondary | ICD-10-CM | POA: Diagnosis present

## 2017-03-11 LAB — GASTROINTESTINAL PANEL BY PCR, STOOL (REPLACES STOOL CULTURE)
Adenovirus F40/41: NOT DETECTED
Astrovirus: NOT DETECTED
CRYPTOSPORIDIUM: NOT DETECTED
CYCLOSPORA CAYETANENSIS: NOT DETECTED
Campylobacter species: NOT DETECTED
ENTAMOEBA HISTOLYTICA: NOT DETECTED
Enteroaggregative E coli (EAEC): NOT DETECTED
Enteropathogenic E coli (EPEC): DETECTED — AB
Enterotoxigenic E coli (ETEC): NOT DETECTED
Giardia lamblia: NOT DETECTED
Norovirus GI/GII: NOT DETECTED
PLESIMONAS SHIGELLOIDES: NOT DETECTED
Rotavirus A: NOT DETECTED
SALMONELLA SPECIES: DETECTED — AB
Sapovirus (I, II, IV, and V): NOT DETECTED
Shiga like toxin producing E coli (STEC): NOT DETECTED
Shigella/Enteroinvasive E coli (EIEC): NOT DETECTED
VIBRIO CHOLERAE: NOT DETECTED
VIBRIO SPECIES: NOT DETECTED
Yersinia enterocolitica: NOT DETECTED

## 2017-03-11 LAB — TSH: TSH: 2.123 u[IU]/mL (ref 0.350–4.500)

## 2017-03-11 LAB — C DIFFICILE QUICK SCREEN W PCR REFLEX
C DIFFICILE (CDIFF) INTERP: NOT DETECTED
C Diff antigen: NEGATIVE
C Diff toxin: NEGATIVE

## 2017-03-11 LAB — IRON AND TIBC: TIBC: 309 ug/dL (ref 250–450)

## 2017-03-11 LAB — RAPID HIV SCREEN (HIV 1/2 AB+AG)
HIV 1/2 ANTIBODIES: NONREACTIVE
HIV-1 P24 Antigen - HIV24: NONREACTIVE

## 2017-03-11 LAB — FERRITIN: Ferritin: 29 ng/mL (ref 11–307)

## 2017-03-11 LAB — LACTIC ACID, PLASMA: Lactic Acid, Venous: 1.5 mmol/L (ref 0.5–1.9)

## 2017-03-11 MED ORDER — CIPROFLOXACIN HCL 500 MG PO TABS
500.0000 mg | ORAL_TABLET | Freq: Two times a day (BID) | ORAL | Status: DC
  Administered 2017-03-11 – 2017-03-13 (×5): 500 mg via ORAL
  Filled 2017-03-11 (×6): qty 1

## 2017-03-11 MED ORDER — ENOXAPARIN SODIUM 40 MG/0.4ML ~~LOC~~ SOLN
40.0000 mg | SUBCUTANEOUS | Status: DC
  Administered 2017-03-13: 40 mg via SUBCUTANEOUS
  Filled 2017-03-11 (×2): qty 0.4

## 2017-03-11 MED ORDER — ONDANSETRON HCL 4 MG PO TABS: 4.0000 mg | ORAL_TABLET | Freq: Four times a day (QID) | ORAL | Status: DC | PRN

## 2017-03-11 MED ORDER — MORPHINE SULFATE (PF) 2 MG/ML IV SOLN
INTRAVENOUS | Status: AC
  Filled 2017-03-11: qty 1

## 2017-03-11 MED ORDER — MORPHINE SULFATE (PF) 2 MG/ML IV SOLN
2.0000 mg | Freq: Once | INTRAVENOUS | Status: AC
  Administered 2017-03-11: 2 mg via INTRAVENOUS

## 2017-03-11 MED ORDER — ACETAMINOPHEN 325 MG PO TABS
650.0000 mg | ORAL_TABLET | Freq: Four times a day (QID) | ORAL | Status: DC | PRN
  Administered 2017-03-11 – 2017-03-12 (×2): 650 mg via ORAL
  Filled 2017-03-11 (×3): qty 2

## 2017-03-11 MED ORDER — ORAL CARE MOUTH RINSE
15.0000 mL | Freq: Two times a day (BID) | OROMUCOSAL | Status: DC
  Administered 2017-03-11 – 2017-03-13 (×3): 15 mL via OROMUCOSAL

## 2017-03-11 MED ORDER — ACETAMINOPHEN 650 MG RE SUPP: 650.0000 mg | Freq: Four times a day (QID) | RECTAL | Status: DC | PRN

## 2017-03-11 MED ORDER — MORPHINE SULFATE (PF) 2 MG/ML IV SOLN
2.0000 mg | INTRAVENOUS | Status: DC | PRN
  Administered 2017-03-11 – 2017-03-12 (×4): 2 mg via INTRAVENOUS
  Filled 2017-03-11 (×4): qty 1

## 2017-03-11 MED ORDER — DOCUSATE SODIUM 100 MG PO CAPS
100.0000 mg | ORAL_CAPSULE | Freq: Two times a day (BID) | ORAL | Status: DC
  Filled 2017-03-11: qty 1

## 2017-03-11 MED ORDER — ONDANSETRON HCL 4 MG/2ML IJ SOLN
4.0000 mg | Freq: Four times a day (QID) | INTRAMUSCULAR | Status: DC | PRN
  Administered 2017-03-11 – 2017-03-13 (×5): 4 mg via INTRAVENOUS
  Filled 2017-03-11 (×5): qty 2

## 2017-03-11 MED ORDER — PIPERACILLIN-TAZOBACTAM 3.375 G IVPB
3.3750 g | Freq: Three times a day (TID) | INTRAVENOUS | Status: DC
  Administered 2017-03-11 (×2): 3.375 g via INTRAVENOUS
  Filled 2017-03-11 (×5): qty 50

## 2017-03-11 MED ORDER — ALUM & MAG HYDROXIDE-SIMETH 200-200-20 MG/5ML PO SUSP
30.0000 mL | Freq: Four times a day (QID) | ORAL | Status: DC | PRN
  Administered 2017-03-11 (×2): 30 mL via ORAL
  Filled 2017-03-11 (×2): qty 30

## 2017-03-11 MED ORDER — VANCOMYCIN HCL 10 G IV SOLR
1250.0000 mg | Freq: Three times a day (TID) | INTRAVENOUS | Status: DC
  Administered 2017-03-11 (×2): 1250 mg via INTRAVENOUS
  Filled 2017-03-11 (×5): qty 1250

## 2017-03-11 MED ORDER — PANTOPRAZOLE SODIUM 40 MG PO TBEC
40.0000 mg | DELAYED_RELEASE_TABLET | Freq: Every day | ORAL | Status: DC
  Administered 2017-03-11 – 2017-03-13 (×3): 40 mg via ORAL
  Filled 2017-03-11 (×3): qty 1

## 2017-03-11 MED ORDER — ONDANSETRON HCL 4 MG/2ML IJ SOLN
INTRAMUSCULAR | Status: AC
  Filled 2017-03-11: qty 2

## 2017-03-11 MED ORDER — CIPROFLOXACIN IN D5W 400 MG/200ML IV SOLN
400.0000 mg | Freq: Two times a day (BID) | INTRAVENOUS | Status: DC
  Filled 2017-03-11 (×2): qty 200

## 2017-03-11 MED ORDER — ONDANSETRON HCL 4 MG/2ML IJ SOLN
4.0000 mg | Freq: Once | INTRAMUSCULAR | Status: AC
  Administered 2017-03-11: 4 mg via INTRAVENOUS

## 2017-03-11 MED ORDER — SODIUM CHLORIDE 0.9 % IV SOLN
INTRAVENOUS | Status: DC
  Administered 2017-03-11 – 2017-03-13 (×7): via INTRAVENOUS

## 2017-03-11 MED ORDER — METRONIDAZOLE IN NACL 5-0.79 MG/ML-% IV SOLN
500.0000 mg | Freq: Three times a day (TID) | INTRAVENOUS | Status: DC
  Administered 2017-03-11 – 2017-03-12 (×5): 500 mg via INTRAVENOUS
  Filled 2017-03-11 (×6): qty 100

## 2017-03-11 NOTE — Consult Note (Signed)
[facility]ious [facility]     Reason for Consult: Salmonella colitis   Referring Physician: Dustin Flock Date of Admission:  03/10/2017  Active Problems:   Sepsis ([place])  HPI: Crystal Gordon is a 41 y.o. female presenting for acute onset of diarrhea, fever, and chills. On Saturday evening, pt had an episode of chills.. On Sunday morning, she began having diarrhea occurring quite frequently in addition to abdominal pain. She describes diarrhea as runny in nature. These episodes continued on Monday with increasing amounts of pain and episodes of fever/chills with shaking; she reports having diarrhea about every 30 minutes. The diarrhea continued, and she finally decided to come to the hospital on Tuesday.  Pt denies having ever experienced this amount of diarrhea and abdominal pain in the past. She has several theories as to the cause. First, her friend has a chicken coop and gave her some eggs from the chickens which she cooked and consumed. 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 To her knowledge, pt denies anyone else including family, friends, or coworkers having diarrhea. Pt also denies any recent travel.   On Tuesday in the hospital, she began vomiting, described as green in color and has only occurred a handful of times. She reports still having frequent episodes of runny diarrhea, about 8 per day.   Past Medical History:  Diagnosis Date  . ADHD (attention deficit hyperactivity disorder)   . Bipolar 1 disorder ([place])   . GERD (gastroesophageal reflux disease)    Past Surgical History:  Procedure Laterality Date  . EYE SURGERY     Social History  Substance Use Topics  . Smoking status: Former Research scientist (life sciences)  . Smokeless tobacco: Never Used  . Alcohol use Yes     Comment: rarely   Family History  Problem Relation Age of Onset  . Diabetes Mellitus II Paternal  Grandmother     Allergies: No Known Allergies  Current antibiotics: Antibiotics Given (last 72 hours)    Date/Time Action Medication Dose Rate   03/10/17 2345 New Bag/Given   piperacillin-tazobactam (ZOSYN) IVPB 3.375 g 3.375 g 100 mL/hr   03/10/17 2358 New Bag/Given   vancomycin (VANCOCIN) IVPB 1000 mg/200 mL premix 1,000 mg 200 mL/hr   03/11/17 0424 New Bag/Given   metroNIDAZOLE (FLAGYL) IVPB 500 mg 500 mg 100 mL/hr   03/11/17 0655 New Bag/Given   vancomycin (VANCOCIN) 1,250 mg in sodium chloride 0.9 % 250 mL IVPB 1,250 mg 166.7 mL/hr   03/11/17 0752 New Bag/Given   piperacillin-tazobactam (ZOSYN) IVPB 3.375 g 3.375 g 12.5 mL/hr   03/11/17 1050 New Bag/Given   metroNIDAZOLE (FLAGYL) IVPB 500 mg 500 mg 100 mL/hr   03/11/17 1429 New Bag/Given   piperacillin-tazobactam (ZOSYN) IVPB 3.375 g 3.375 g 12.5 mL/hr   03/11/17 1430 New Bag/Given   vancomycin (VANCOCIN) 1,250 mg in sodium chloride 0.9 % 250 mL IVPB 1,250 mg 166.7 mL/hr      MEDICATIONS: . docusate sodium  100 mg Oral BID  . enoxaparin (LOVENOX) injection  40 mg Subcutaneous Q24H  . mouth rinse  15 mL Mouth Rinse BID  . pantoprazole  40 mg Oral Daily   Review of Systems - 11 systems reviewed and negative per HPI  OBJECTIVE: Temp:  [97.9 F (36.6 C)-100.4 F (38 C)] 97.9 F (36.6 C) (06/20 1202) Pulse Rate:  [83-117] 83 (06/20 1202) Resp:  [14-30] 18 (06/20 1202) BP: (102-141)/(48-90)  108/58 (06/20 1202) SpO2:  [96 %-99 %] 98 % (06/20 1202) Weight:  [95.3 kg (210 lb)-97.6 kg (215 lb 1.6 oz)] 97.6 kg (215 lb 1.6 oz) (06/20 0334) Physical Exam  Constitutional:  oriented to person, place, and time. Obese, lying in bed, mod ill appearing. HENT: [place]/AT, PERRLA, no scleral icterus Mouth/Throat: Oropharynx is clear and moist. No oropharyngeal exudate.  Cardiovascular: Normal rate, regular rhythm and normal heart sounds. Pulmonary/Chest: Effort normal and breath sounds normal. No respiratory distress.  has no wheezes.   Neck = supple, no nuchal rigidity Abdominal: Soft. Distended, mild ttp diffuse, mainly RLQ Lymphadenopathy: no cervical adenopathy. No axillary adenopathy Neurological: alert and oriented to person, place, and time.  Skin: Skin is warm and dry. No rash noted. No erythema.  Psychiatric: a normal mood and affect.  behavior is normal.    LABS: Results for orders placed or performed during the hospital encounter of 03/10/17 (from the past 48 hour(s))  Comprehensive metabolic panel     Status: Abnormal   Collection Time: 03/10/17  9:42 PM  Result Value Ref Range   Sodium 134 (L) 135 - 145 mmol/L   Potassium 3.6 3.5 - 5.1 mmol/L   Chloride 98 (L) 101 - 111 mmol/L   CO2 26 22 - 32 mmol/L   Glucose, Bld 170 (H) 65 - 99 mg/dL   BUN 8 6 - 20 mg/dL   Creatinine, Ser 0.92 0.44 - 1.00 mg/dL   Calcium 10.3 8.9 - 10.3 mg/dL   Total Protein 7.3 6.5 - 8.1 g/dL   Albumin 3.5 3.5 - 5.0 g/dL   AST 36 15 - 41 U/L   ALT 13 (L) 14 - 54 U/L   Alkaline Phosphatase 73 38 - 126 U/L   Total Bilirubin 0.4 0.3 - 1.2 mg/dL   GFR calc non Af Amer >60 >60 mL/min   GFR calc Af Amer >60 >60 mL/min    Comment: (NOTE) The eGFR has been calculated using the CKD EPI equation. This calculation has not been validated in all clinical situations. eGFR's persistently <60 mL/min signify possible Chronic Kidney Disease.    Anion gap 10 5 - 15  Lactic acid, plasma     Status: Abnormal   Collection Time: 03/10/17  9:42 PM  Result Value Ref Range   Lactic Acid, Venous 3.2 (HH) 0.5 - 1.9 mmol/L    Comment: CRITICAL RESULT CALLED TO, READ BACK BY AND VERIFIED WITH AMY COYNE AT 2223 ON 03/10/17 BY SNJ   CBC with Differential     Status: Abnormal   Collection Time: 03/10/17  9:42 PM  Result Value Ref Range   WBC 10.3 3.6 - 11.0 K/uL   RBC 4.97 3.80 - 5.20 MIL/uL   Hemoglobin 10.5 (L) 12.0 - 16.0 g/dL   HCT 33.5 (L) 35.0 - 47.0 %   MCV 67.3 (L) 80.0 - 100.0 fL   MCH 21.1 (L) 26.0 - 34.0 pg   MCHC 31.4 (L) 32.0 - 36.0  g/dL   RDW 19.2 (H) 11.5 - 14.5 %   Platelets 321 150 - 440 K/uL   Neutrophils Relative % 81 %   Neutro Abs 8.5 (H) 1.4 - 6.5 K/uL   Lymphocytes Relative 9 %   Lymphs Abs 0.9 (L) 1.0 - 3.6 K/uL   Monocytes Relative 9 %   Monocytes Absolute 0.9 0.2 - 0.9 K/uL   Eosinophils Relative 0 %   Eosinophils Absolute 0.0 0 - 0.7 K/uL   Basophils Relative 1 %  Basophils Absolute 0.1 0 - 0.1 K/uL  Urinalysis, Complete w Microscopic     Status: Abnormal   Collection Time: 03/10/17  9:42 PM  Result Value Ref Range   Color, Urine YELLOW YELLOW   APPearance CLEAR CLEAR   Specific Gravity, Urine 1.020 1.005 - 1.030   pH 5.5 5.0 - 8.0   Glucose, UA NEGATIVE NEGATIVE mg/dL   Hgb urine dipstick MODERATE (A) NEGATIVE   Bilirubin Urine NEGATIVE NEGATIVE   Ketones, ur NEGATIVE NEGATIVE mg/dL   Protein, ur 30 (A) NEGATIVE mg/dL   Nitrite NEGATIVE NEGATIVE   Leukocytes, UA NEGATIVE NEGATIVE   Squamous Epithelial / LPF 0-5 (A) NONE SEEN   WBC, UA 0-5 0 - 5 WBC/hpf   RBC / HPF 0-5 0 - 5 RBC/hpf   Bacteria, UA RARE (A) NONE SEEN   Mucous PRESENT   Pregnancy, urine POC     Status: None   Collection Time: 03/10/17  9:54 PM  Result Value Ref Range   Preg Test, Ur NEGATIVE NEGATIVE    Comment:        THE SENSITIVITY OF THIS METHODOLOGY IS >24 mIU/mL   Blood Culture (routine x 2)     Status: None (Preliminary result)   Collection Time: 03/10/17 10:39 PM  Result Value Ref Range   Specimen Description BLOOD LT AC    Special Requests      BOTTLES DRAWN AEROBIC AND ANAEROBIC Blood Culture adequate volume   Culture NO GROWTH < 12 HOURS    Report Status PENDING   Blood Culture (routine x 2)     Status: None (Preliminary result)   Collection Time: 03/10/17 10:39 PM  Result Value Ref Range   Specimen Description BLOOD RT AC    Special Requests      BOTTLES DRAWN AEROBIC AND ANAEROBIC Blood Culture adequate volume   Culture NO GROWTH < 12 HOURS    Report Status PENDING   Gastrointestinal Panel by PCR  , Stool     Status: Abnormal   Collection Time: 03/10/17 10:39 PM  Result Value Ref Range   Campylobacter species NOT DETECTED NOT DETECTED   Plesimonas shigelloides NOT DETECTED NOT DETECTED   Salmonella species DETECTED (A) NOT DETECTED    Comment: RESULT CALLED TO, READ BACK BY AND VERIFIED WITH: REBECCA UHORCHUK @ 2831 ON 03/11/2017 BY CAF    Yersinia enterocolitica NOT DETECTED NOT DETECTED   Vibrio species NOT DETECTED NOT DETECTED   Vibrio cholerae NOT DETECTED NOT DETECTED   Enteroaggregative E coli (EAEC) NOT DETECTED NOT DETECTED   Enteropathogenic E coli (EPEC) DETECTED (A) NOT DETECTED    Comment: RESULT CALLED TO, READ BACK BY AND VERIFIED WITH: REBECCA UHORCHUK @ 5176 ON 03/11/2017 BY CAF    Enterotoxigenic E coli (ETEC) NOT DETECTED NOT DETECTED   Shiga like toxin producing E coli (STEC) NOT DETECTED NOT DETECTED   Shigella/Enteroinvasive E coli (EIEC) NOT DETECTED NOT DETECTED   Cryptosporidium NOT DETECTED NOT DETECTED   Cyclospora cayetanensis NOT DETECTED NOT DETECTED   Entamoeba histolytica NOT DETECTED NOT DETECTED   Giardia lamblia NOT DETECTED NOT DETECTED   Adenovirus F40/41 NOT DETECTED NOT DETECTED   Astrovirus NOT DETECTED NOT DETECTED   Norovirus GI/GII NOT DETECTED NOT DETECTED   Rotavirus A NOT DETECTED NOT DETECTED   Sapovirus (I, II, IV, and V) NOT DETECTED NOT DETECTED  C difficile quick scan w PCR reflex     Status: None   Collection Time: 03/10/17 10:39 PM  Result Value  Ref Range   C Diff antigen NEGATIVE NEGATIVE   C Diff toxin NEGATIVE NEGATIVE   C Diff interpretation No C. difficile detected.     Comment: VALID  Lactic acid, plasma     Status: None   Collection Time: 03/11/17 12:54 AM  Result Value Ref Range   Lactic Acid, Venous 1.5 0.5 - 1.9 mmol/L  TSH     Status: None   Collection Time: 03/11/17  3:59 AM  Result Value Ref Range   TSH 2.123 0.350 - 4.500 uIU/mL    Comment: Performed by a 3rd Generation assay with a functional  sensitivity of <=0.01 uIU/mL.  Rapid HIV screen (HIV 1/2 Ab+Ag)     Status: None   Collection Time: 03/11/17  2:34 PM  Result Value Ref Range   HIV-1 P24 Antigen - HIV24 NON REACTIVE NON REACTIVE   HIV 1/2 Antibodies NON REACTIVE NON REACTIVE   Interpretation (HIV Ag Ab)      A non reactive test result means that HIV 1 or HIV 2 antibodies and HIV 1 p24 antigen were not detected in the specimen.   No components found for: ESR, C REACTIVE PROTEIN MICRO: Recent Results (from the past 720 hour(s))  Blood Culture (routine x 2)     Status: None (Preliminary result)   Collection Time: 03/10/17 10:39 PM  Result Value Ref Range Status   Specimen Description BLOOD LT [facility]  Final   Special Requests   Final    BOTTLES DRAWN AEROBIC AND ANAEROBIC Blood Culture adequate volume   Culture NO GROWTH < 12 HOURS  Final   Report Status PENDING  Incomplete  Blood Culture (routine x 2)     Status: None (Preliminary result)   Collection Time: 03/10/17 10:39 PM  Result Value Ref Range Status   Specimen Description BLOOD RT [facility]  Final   Special Requests   Final    BOTTLES DRAWN AEROBIC AND ANAEROBIC Blood Culture adequate volume   Culture NO GROWTH < 12 HOURS  Final   Report Status PENDING  Incomplete  Gastrointestinal Panel by PCR , Stool     Status: Abnormal   Collection Time: 03/10/17 10:39 PM  Result Value Ref Range Status   Campylobacter species NOT DETECTED NOT DETECTED Final   Plesimonas shigelloides NOT DETECTED NOT DETECTED Final   Salmonella species DETECTED (A) NOT DETECTED Final    Comment: RESULT CALLED TO, READ BACK BY AND VERIFIED WITH: REBECCA UHORCHUK @ 2725 ON 03/11/2017 BY CAF    Yersinia enterocolitica NOT DETECTED NOT DETECTED Final   Vibrio species NOT DETECTED NOT DETECTED Final   Vibrio cholerae NOT DETECTED NOT DETECTED Final   Enteroaggregative E coli (EAEC) NOT DETECTED NOT DETECTED Final   Enteropathogenic E coli (EPEC) DETECTED (A) NOT DETECTED Final    Comment: RESULT  CALLED TO, READ BACK BY AND VERIFIED WITH: REBECCA UHORCHUK @ 3664 ON 03/11/2017 BY CAF    Enterotoxigenic E coli (ETEC) NOT DETECTED NOT DETECTED Final   Shiga like toxin producing E coli (STEC) NOT DETECTED NOT DETECTED Final   Shigella/Enteroinvasive E coli (EIEC) NOT DETECTED NOT DETECTED Final   Cryptosporidium NOT DETECTED NOT DETECTED Final   Cyclospora cayetanensis NOT DETECTED NOT DETECTED Final   Entamoeba histolytica NOT DETECTED NOT DETECTED Final   Giardia lamblia NOT DETECTED NOT DETECTED Final   Adenovirus F40/41 NOT DETECTED NOT DETECTED Final   Astrovirus NOT DETECTED NOT DETECTED Final   Norovirus GI/GII NOT DETECTED NOT DETECTED Final   Rotavirus  A NOT DETECTED NOT DETECTED Final   Sapovirus (I, II, IV, and V) NOT DETECTED NOT DETECTED Final  C difficile quick scan w PCR reflex     Status: None   Collection Time: 03/10/17 10:39 PM  Result Value Ref Range Status   C Diff antigen NEGATIVE NEGATIVE Final   C Diff toxin NEGATIVE NEGATIVE Final   C Diff interpretation No C. difficile detected.  Final    Comment: VALID    IMAGING: Ct Abdomen Pelvis W Contrast  Result Date: 03/11/2017 CLINICAL DATA:  Fever and right lower quadrant pain. Body aches, diarrhea and nausea for 2 days. EXAM: CT ABDOMEN AND PELVIS WITH CONTRAST TECHNIQUE: Multidetector CT imaging of the abdomen and pelvis was performed using the standard protocol following bolus administration of intravenous contrast. CONTRAST:  182m ISOVUE-300 IOPAMIDOL (ISOVUE-300) INJECTION 61% COMPARISON:  None. FINDINGS: Lower chest: Lung bases are clear. Hepatobiliary: No focal liver abnormality is seen. No gallstones, gallbladder wall thickening, or biliary dilatation. Pancreas: No ductal dilatation or inflammation. Spleen: Normal in size without focal abnormality. Adrenals/Urinary Tract: Adrenal glands are unremarkable. Kidneys are normal, without renal calculi, focal lesion, or hydronephrosis. Symmetric renal excretion on  delayed phase imaging per Bladder is unremarkable. Stomach/Bowel: Pan colonic wall thickening and enhancement of the entire colon. Mild pericolonic soft tissue edema involves the cecum and ascending colon. Liquid stool throughout the colon consistent with diarrheal process. The appendix is upper normal in size measuring 7 mm with without surrounding inflammatory change. Small bowel is decompressed without wall thickening or inflammation. Stomach is decompressed. Small hiatal hernia. Vascular/Lymphatic: Enlarged ileocolic lymph nodes largest measuring 11 mm, likely reactive. Small central mesenteric nodes. No retroperitoneal or pelvic adenopathy. Abdominal aorta is normal in caliber. Portal vein appears patent. Reproductive: Uterus and bilateral adnexa are unremarkable. Other: No free air, free fluid, or intra-abdominal fluid collection. Musculoskeletal: There are no acute or suspicious osseous abnormalities. IMPRESSION: 1. Pancolitis, may be infectious or inflammatory. Findings are most pronounced in the cecum and ascending colon. Adjacent ileocolic lymph nodes are likely reactive. 2. No evidence of appendicitis. Electronically Signed   By: MJeb LeveringM.D.   On: 03/11/2017 00:06   Dg Chest Port 1 View  Result Date: 03/10/2017 CLINICAL DATA:  41year old female with fever. EXAM: PORTABLE CHEST 1 VIEW COMPARISON:  Chest radiograph dated 05/29/2016 FINDINGS: The heart size and mediastinal contours are within normal limits. Both lungs are clear. The visualized skeletal structures are unremarkable. IMPRESSION: No active disease. Electronically Signed   By: AAnner CreteM.D.   On: 03/10/2017 23:55    Assessment:   HRandall Rampersadis a 41y.o. female admitted for acute onset fever/chills and diarrhea. 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 Recommendations Change to cipro - would suggest a 7 day course Continue supportive  care. I have discussed with [facility] and they will follow up with her.   Thank you very much for allowing me to participate in the care of this patient. Please call with questions.   DCheral Marker FOla Spurr MD

## 2017-03-11 NOTE — Consult Note (Signed)
Pharmacy Antibiotic Note  Arturo MortonHolly Nevares is a 41 y.o. female admitted on 03/10/2017 with intra abdominal infection- stool positive for ecoli and salmonella.  Pharmacy has been consulted for cipro dosing.  Plan: Ciprofloxacin 500mg  po BID  Height: 5' 6.5" (168.9 cm) Weight: 215 lb 1.6 oz (97.6 kg) IBW/kg (Calculated) : 60.45  Temp (24hrs), Avg:99.4 F (37.4 C), Min:97.9 F (36.6 C), Max:100.4 F (38 C)   Recent Labs Lab 03/10/17 2142 03/11/17 0054  WBC 10.3  --   CREATININE 0.92  --   LATICACIDVEN 3.2* 1.5    Estimated Creatinine Clearance: 95.7 mL/min (by C-G formula based on SCr of 0.92 mg/dL).    No Known Allergies  Antimicrobials this admission: zosyn 6/20 >> 6/20 metronidazole 6/20 >>  vanc 6/20>>6/20 cipro 6/20>>  Dose adjustments this admission:   Microbiology results: Results for orders placed or performed during the hospital encounter of 03/10/17  Blood Culture (routine x 2)     Status: None (Preliminary result)   Collection Time: 03/10/17 10:39 PM  Result Value Ref Range Status   Specimen [facility]  Final   Special Requests   Final    BOTTLES DRAWN AEROBIC AND ANAEROBIC Blood Culture adequate volume   Culture NO GROWTH < 12 HOURS  Final   Report Status PENDING  Incomplete  Blood Culture (routine x 2)     Status: None (Preliminary result)   Collection Time: 03/10/17 10:39 PM  Result Value Ref Range Status   [facility]  [facility]   Special Requests   Final    BOTTLES DRAWN AEROBIC AND ANAEROBIC Blood Culture adequate volume   Culture NO GROWTH < 12 HOURS  Final   Report Status PENDING  Incomplete  Gastrointestinal Panel by PCR , Stool     Status: Abnormal   Collection Time: 03/10/17 10:39 PM  Result Value Ref Range Status   Campylobacter species NOT DETECTED NOT DETECTED Final   Plesimonas shigelloides NOT DETECTED NOT DETECTED Final   Salmonella species DETECTED (A) NOT DETECTED Final    Comment: RESULT CALLED TO,  READ BACK BY AND VERIFIED WITH: REBECCA UHORCHUK @ 0204 ON 03/11/2017 BY CAF    Yersinia enterocolitica NOT DETECTED NOT DETECTED Final   Vibrio species NOT DETECTED NOT DETECTED Final   Vibrio cholerae NOT DETECTED NOT DETECTED Final   Enteroaggregative E coli (EAEC) NOT DETECTED NOT DETECTED Final   Enteropathogenic E coli (EPEC) DETECTED (A) NOT DETECTED Final    Comment: RESULT CALLED TO, READ BACK BY AND VERIFIED WITH: REBECCA UHORCHUK @ 0204 ON 03/11/2017 BY CAF    Enterotoxigenic E coli (ETEC) NOT DETECTED NOT DETECTED Final   Shiga like toxin producing E coli (STEC) NOT DETECTED NOT DETECTED Final   Shigella/Enteroinvasive E coli (EIEC) NOT DETECTED NOT DETECTED Final   Cryptosporidium NOT DETECTED NOT DETECTED Final   Cyclospora cayetanensis NOT DETECTED NOT DETECTED Final   Entamoeba histolytica NOT DETECTED NOT DETECTED Final   Giardia lamblia NOT DETECTED NOT DETECTED Final   Adenovirus F40/41 NOT DETECTED NOT DETECTED Final   Astrovirus NOT DETECTED NOT DETECTED Final   Norovirus GI/GII NOT DETECTED NOT DETECTED Final   Rotavirus A NOT DETECTED NOT DETECTED Final   Sapovirus (I, II, IV, and V) NOT DETECTED NOT DETECTED Final  C difficile quick scan w PCR reflex     Status: None   Collection Time: 03/10/17 10:39 PM  Result Value Ref Range Status   C Diff antigen NEGATIVE NEGATIVE Final  C Diff toxin NEGATIVE NEGATIVE Final   C Diff interpretation No C. difficile detected.  Final    Comment: VALID    Thank you for allowing pharmacy to be a part of this patient's care.  Olene Floss, Pharm.D, BCPS Clinical Pharmacist  03/11/2017 3:45 PM

## 2017-03-11 NOTE — Consult Note (Signed)
[name] MD, MRCP(U.K) [address]  [address]  [place], [place] [address]  Main: [phone]  Fax: [phone]  Consultation  Referring Provider:     Dr Allena Katz  Primary Care Physician:  Patient, No Pcp Per Primary Gastroenterologist:  None          Reason for Consultation:     Colitis   Date of Admission:  03/10/2017 Date of Consultation:  03/11/2017         HPI:   Crystal Gordon is a 41 y.o. female presented to the ER with diarrhea and abdominal pain early this morning . Admitted to the ICU for sepsis.   Ct scan of the abdomen showed pancolitis more prominent in the cecum and ascending colon.   She works at [employer] , consumes a Government social research officer . She says since Sunday developed fever followed by nausea, vomiting and abdominal pain . She has used some NSAID's recently . Denies any sick contacts, has been having atleast 5 watery bowel movements a day , feels the pain is probably worse since coming into the hospital .   Denies any prior evaluation for anemia  Labs : in 04/2016 noted to have a microcytic anemia . On admission had normal liver tests , elevated lactic acid , microcytic anemia Hb 10.5 and mcv of 67 .   Stool GI PCR shows salmonella and enteropathogenic e coli . C diff is negative   I have discussed with Dr Sampson Goon to help with using antibiotics   Past Medical History:  Diagnosis Date  . ADHD (attention deficit hyperactivity disorder)   . Bipolar 1 disorder (HCC)   . GERD (gastroesophageal reflux disease)     Past Surgical History:  Procedure Laterality Date  . EYE SURGERY      Prior to Admission medications   Medication Sig Start Date End Date Taking? Authorizing Provider  omeprazole (PRILOSEC) 20 MG capsule Take 20 mg by mouth daily.   Yes [provider]    Family History  Problem Relation Age of Onset  . Diabetes Mellitus II Paternal Grandmother      Social History  Substance Use Topics  . Smoking status: Former Games developer    . Smokeless tobacco: Never Used  . Alcohol use Yes     Comment: rarely    Allergies as of 03/10/2017  . (No Known Allergies)    Review of Systems:    All systems reviewed and negative except where noted in HPI.   Physical Exam:  Vital signs in last 24 hours: Temp:  [97.9 F (36.6 C)-100.4 F (38 C)] 97.9 F (36.6 C) (06/20 1202) Pulse Rate:  [83-117] 83 (06/20 1202) Resp:  [14-30] 18 (06/20 1202) BP: (102-141)/(48-90) 108/58 (06/20 1202) SpO2:  [96 %-99 %] 98 % (06/20 1202) Weight:  [210 lb (95.3 kg)-215 lb 1.6 oz (97.6 kg)] 215 lb 1.6 oz (97.6 kg) (06/20 0334) Last BM Date: 03/11/17 General:   Pleasant, cooperative in NAD Head:  Normocephalic and atraumatic. Eyes:   No icterus.   Conjunctiva pink. PERRLA. Ears:  Normal auditory acuity. Neck:  Supple; no masses or thyroidomegaly Lungs: Respirations even and unlabored. Lungs clear to auscultation bilaterally.   No wheezes, crackles, or rhonchi.  Heart:  Regular rate and rhythm;  Without murmur, clicks, rubs or gallops Abdomen:  Soft, mildly distended , mild to moderate RLQ tendenrness, nontender. Normal bowel sounds. No appreciable masses or hepatomegaly.  No rebound or guarding.  Rectal:  Not performed. Neurologic:  Alert and oriented x3;  grossly normal neurologically. Skin:  Intact without significant lesions or rashes. Cervical Nodes:  No significant cervical adenopathy. Psych:  Alert and cooperative. Normal affect.  LAB RESULTS:  Recent Labs  03/10/17 2142  WBC 10.3  HGB 10.5*  HCT 33.5*  PLT 321   BMET  Recent Labs  03/10/17 2142  NA 134*  K 3.6  CL 98*  CO2 26  GLUCOSE 170*  BUN 8  CREATININE 0.92  CALCIUM 10.3   LFT  Recent Labs  03/10/17 2142  PROT 7.3  ALBUMIN 3.5  AST 36  ALT 13*  ALKPHOS 73  BILITOT 0.4   PT/INR No results for input(s): LABPROT, INR in the last 72 hours.  STUDIES: Ct Abdomen Pelvis W Contrast  Result Date: 03/11/2017 CLINICAL DATA:  Fever and right lower  quadrant pain. Body aches, diarrhea and nausea for 2 days. EXAM: CT ABDOMEN AND PELVIS WITH CONTRAST TECHNIQUE: Multidetector CT imaging of the abdomen and pelvis was performed using the standard protocol following bolus administration of intravenous contrast. CONTRAST:  100mL ISOVUE-300 IOPAMIDOL (ISOVUE-300) INJECTION 61% COMPARISON:  None. FINDINGS: Lower chest: Lung bases are clear. Hepatobiliary: No focal liver abnormality is seen. No gallstones, gallbladder wall thickening, or biliary dilatation. Pancreas: No ductal dilatation or inflammation. Spleen: Normal in size without focal abnormality. Adrenals/Urinary Tract: Adrenal glands are unremarkable. Kidneys are normal, without renal calculi, focal lesion, or hydronephrosis. Symmetric renal excretion on delayed phase imaging per Bladder is unremarkable. Stomach/Bowel: Pan colonic wall thickening and enhancement of the entire colon. Mild pericolonic soft tissue edema involves the cecum and ascending colon. Liquid stool throughout the colon consistent with diarrheal process. The appendix is upper normal in size measuring 7 mm with without surrounding inflammatory change. Small bowel is decompressed without wall thickening or inflammation. Stomach is decompressed. Small hiatal hernia. Vascular/Lymphatic: Enlarged ileocolic lymph nodes largest measuring 11 mm, likely reactive. Small central mesenteric nodes. No retroperitoneal or pelvic adenopathy. Abdominal aorta is normal in caliber. Portal vein appears patent. Reproductive: Uterus and bilateral adnexa are unremarkable. Other: No free air, free fluid, or intra-abdominal fluid collection. Musculoskeletal: There are no acute or suspicious osseous abnormalities. IMPRESSION: 1. Pancolitis, may be infectious or inflammatory. Findings are most pronounced in the cecum and ascending colon. Adjacent ileocolic lymph nodes are likely reactive. 2. No evidence of appendicitis. Electronically Signed   By: Rubye OaksMelanie  Ehinger M.D.    On: 03/11/2017 00:06   Dg Chest Port 1 View  Result Date: 03/10/2017 CLINICAL DATA:  41 year old female with fever. EXAM: PORTABLE CHEST 1 VIEW COMPARISON:  Chest radiograph dated 05/29/2016 FINDINGS: The heart size and mediastinal contours are within normal limits. Both lungs are clear. The visualized skeletal structures are unremarkable. IMPRESSION: No active disease. Electronically Signed   By: Elgie CollardArash  Radparvar M.D.   On: 03/10/2017 23:55      Impression / Plan:   Arturo MortonHolly Astacio is a 41 y.o. y/o female with presented to the hospital with acute salmonella gastroenteritis . Stool PCR is positive for both enteropathogenic ecoli as well as salmonella.   Plan  1. For salmonella gastroenteritis admitted to the hospital with sepsis antibiotics are warranted and are usually flouroquinolones for about 14 days . In this present scenario she also has E coli and am not sure of the resistance patterns here and ideally one antibiotic for both bugs would be good. Salmonella is also a notifiable disease to the local health authorities .I have discussed this with Dr Sampson GoonFitzgerald.  At the  time of discharge she should be provided adequate information on Hand hygiene that should be performed after using the toilet, changing diapers, before and after preparing food, before eating, after handling garbage or soiled laundry items  2. I will get in contact with the lab to see if they can particularly rule out e coli 0157   3.ID consult   4. She also has microcytic anemia and likely iron deficiency - will order iron studies and will need out patient EGD+colonoscopy   5. Serial abdominal exam. If pain increases or has tachycardia watch for toxic megacolon- get abdominal imaging and possible surgery involved if imaging shows toxic megacolon.   6. I will get an X ray now since she says the pain is worse since AM  Thank you for involving me in the care of this patient.      LOS: 0 days   [name], MD  03/11/2017,  1:21 PM

## 2017-03-11 NOTE — ED Notes (Signed)
Mordecai MaesJason Allport (patient's husband) left business card with phone numbers work [phone] Cell [phone]

## 2017-03-11 NOTE — Progress Notes (Signed)
Pharmacy Antibiotic Note  Crystal MortonHolly Gordon is a 41 y.o. female admitted on 03/10/2017 with sepsis.  Pharmacy has been consulted for vanc/zosyn dosing.  Plan: Zosyn 3.375g IV q8h (4 hour infusion). Patient received vanc 1g IV x 1 in ED Will follow up w/ vanc 1.25g IV q8h w/ 6 hour stack dose. Will draw a vanc trough 6/21 @ 0500 prior to 4th dose. Ke 0.0828 T1/2 8 hours Css 15 mcg/mL  Height: 5' 6.5" (168.9 cm) Weight: 210 lb (95.3 kg) IBW/kg (Calculated) : 60.45  Temp (24hrs), Avg:100.4 F (38 C), Min:100.4 F (38 C), Max:100.4 F (38 C)   Recent Labs Lab 03/10/17 2142  WBC 10.3  CREATININE 0.92  LATICACIDVEN 3.2*    Estimated Creatinine Clearance: 94.5 mL/min (by C-G formula based on SCr of 0.92 mg/dL).    No Known Allergies   Thank you for allowing pharmacy to be a part of this patient's care.  Crystal Gordon, PharmD, BCPS Clinical Pharmacist 03/11/2017

## 2017-03-11 NOTE — Plan of Care (Signed)
Problem: Education: Goal: Knowledge of [facility] Education information/materials will improve Outcome: Progressing Oriented to unit.  Reported nausea late in shift, emesis x1, received PRN IV Zofran 4mg .  Reported abdominal cramping late in shift, will receive requested PRN Tylenol.  No other complaints overnight.  Bed in low position, commode at bedside.  Call bell within reach, WCTM.

## 2017-03-11 NOTE — Progress Notes (Signed)
[employer] - [place] [facility]                                                                                                                                                                                  Patient Demographics   Crystal Gordon, is a 41 y.o. female, DOB - December 08, 1975, UEA:[id number]  Admit date - 03/10/2017   Admitting Physician Arnaldo Natal, MD  Outpatient Primary MD for the patient is Patient, No Pcp Per   LOS - 0  Subjective: Patient admitted with abdominal pain and diarrhea and high-grade fevers. She complains of abdomen being distended. Also complains of abdominal pain. As well as fevers    Review of Systems:   CONSTITUTIONAL:Positive fever. No fatigue, weakness. No weight gain, no weight loss.  EYES: No blurry or double vision.  ENT: No tinnitus. No postnasal drip. No redness of the oropharynx.  RESPIRATORY: No cough, no wheeze, no hemoptysis. No dyspnea.  CARDIOVASCULAR: No chest pain. No orthopnea. No palpitations. No syncope.  GASTROINTESTINAL: No nausea, no vomiting or Positive diarrhea. Positive abdominal pain. No melena or hematochezia.  GENITOURINARY: No dysuria or hematuria.  ENDOCRINE: No polyuria or nocturia. No heat or cold intolerance.  HEMATOLOGY: No anemia. No bruising. No bleeding.  INTEGUMENTARY: No rashes. No lesions.  MUSCULOSKELETAL: No arthritis. No swelling. No gout.  NEUROLOGIC: No numbness, tingling, or ataxia. No seizure-type activity.  PSYCHIATRIC: No anxiety. No insomnia. No ADD.    Vitals:   Vitals:   03/11/17 0147 03/11/17 0200 03/11/17 0334 03/11/17 1202  BP: 103/90 125/74 (!) 102/48 (!) 108/58  Pulse:  91 (!) 104 83  Resp:  20 14 18   Temp:   100 F (37.8 C) 97.9 F (36.6 C)  TempSrc:   Oral Oral  SpO2:  96% 99% 98%  Weight:   215 lb 1.6 oz (97.6 kg)   Height:   5' 6.5" (1.689 m)     Wt Readings from Last 3 Encounters:  03/11/17 215 lb 1.6 oz (97.6 kg)  05/29/16 205 lb (93 kg)  05/11/16  206 lb (93.4 kg)     Intake/Output Summary (Last 24 hours) at 03/11/17 1407 Last data filed at 03/11/17 0655  Gross per 24 hour  Intake             3565 ml  Output                0 ml  Net             3565 ml    Physical Exam:   GENERAL: Pleasant-appearing in no apparent distress.  HEAD, EYES, EARS, NOSE AND THROAT: Atraumatic, normocephalic. Extraocular muscles are  intact. Pupils equal and reactive to light. Sclerae anicteric. No conjunctival injection. No oro-pharyngeal erythema.  NECK: Supple. There is no jugular venous distention. No bruits, no lymphadenopathy, no thyromegaly.  HEART: Regular rate and rhythm,. No murmurs, no rubs, no clicks.  LUNGS: Clear to auscultation bilaterally. No rales or rhonchi. No wheezes.  ABDOMEN: Distended , has lower abdominal tenderness without guarding or rebound positive bowel sounds EXTREMITIES: No evidence of any cyanosis, clubbing, or peripheral edema.  +2 pedal and radial pulses bilaterally.  NEUROLOGIC: The patient is alert, awake, and oriented x3 with no focal motor or sensory deficits appreciated bilaterally.  SKIN: Moist and warm with no rashes appreciated.  Psych: Not anxious, depressed LN: No inguinal LN enlargement    Antibiotics   Anti-infectives    Start     Dose/Rate Route Frequency Ordered Stop   03/11/17 0800  piperacillin-tazobactam (ZOSYN) IVPB 3.375 g     3.375 g 12.5 mL/hr over 240 Minutes Intravenous Every 8 hours 03/11/17 0126     03/11/17 0600  vancomycin (VANCOCIN) 1,250 mg in sodium chloride 0.9 % 250 mL IVPB     1,250 mg 166.7 mL/hr over 90 Minutes Intravenous Every 8 hours 03/11/17 0125     03/11/17 0330  metroNIDAZOLE (FLAGYL) IVPB 500 mg     500 mg 100 mL/hr over 60 Minutes Intravenous Every 8 hours 03/11/17 0328     03/10/17 2330  piperacillin-tazobactam (ZOSYN) IVPB 3.375 g     3.375 g 100 mL/hr over 30 Minutes Intravenous  Once 03/10/17 2327 03/11/17 0015   03/10/17 2330  vancomycin (VANCOCIN) IVPB 1000  mg/200 mL premix     1,000 mg 200 mL/hr over 60 Minutes Intravenous  Once 03/10/17 2327 03/11/17 0101      Medications   Scheduled Meds: . docusate sodium  100 mg Oral BID  . enoxaparin (LOVENOX) injection  40 mg Subcutaneous Q24H  . mouth rinse  15 mL Mouth Rinse BID  . pantoprazole  40 mg Oral Daily   Continuous Infusions: . sodium chloride 150 mL/hr at 03/11/17 0423  . metronidazole Stopped (03/11/17 1313)  . piperacillin-tazobactam (ZOSYN)  IV Stopped (03/11/17 1152)  . vancomycin Stopped (03/11/17 0948)   PRN Meds:.acetaminophen **OR** acetaminophen, alum & mag hydroxide-simeth, morphine injection, ondansetron **OR** ondansetron (ZOFRAN) IV   Data Review:   Micro Results Recent Results (from the past 240 hour(s))  Blood Culture (routine x 2)     Status: None (Preliminary result)   Collection Time: 03/10/17 10:39 PM  Result Value Ref Range Status   Spec[facility]  Final   Special Requests   Final    BOTTLES DRAWN AEROBIC AND ANAEROBIC Blood Culture adequate volume   Culture NO GROWTH < 12 HOURS  Final   Report Status PENDING  Incomplete  Blood Culture (routine x 2)     Status: None (Preliminary result)   Collection Time: 03/10/17 10:39 PM  Result Value Ref Range Status   Spec[facility]  Final   Special Requests   Final    BOTTLES DRAWN AEROBIC AND ANAEROBIC Blood Culture adequate volume   Culture NO GROWTH < 12 HOURS  Final   Report Status PENDING  Incomplete  Gastrointestinal Panel by PCR , Stool     Status: Abnormal   Collection Time: 03/10/17 10:39 PM  Result Value Ref Range Status   Campylobacter species NOT DETECTED NOT DETECTED Final   Plesimonas shigelloides NOT DETECTED NOT DETECTED Final   Salmonella species  DETECTED (A) NOT DETECTED Final    Comment: RESULT CALLED TO, READ BACK BY AND VERIFIED WITH: REBECCA UHORCHUK @ 0204 ON 03/11/2017 BY CAF    Yersinia enterocolitica NOT DETECTED NOT DETECTED Final   Vibrio  species NOT DETECTED NOT DETECTED Final   Vibrio cholerae NOT DETECTED NOT DETECTED Final   Enteroaggregative E coli (EAEC) NOT DETECTED NOT DETECTED Final   Enteropathogenic E coli (EPEC) DETECTED (A) NOT DETECTED Final    Comment: RESULT CALLED TO, READ BACK BY AND VERIFIED WITH: REBECCA UHORCHUK @ 0204 ON 03/11/2017 BY CAF    Enterotoxigenic E coli (ETEC) NOT DETECTED NOT DETECTED Final   Shiga like toxin producing E coli (STEC) NOT DETECTED NOT DETECTED Final   Shigella/Enteroinvasive E coli (EIEC) NOT DETECTED NOT DETECTED Final   Cryptosporidium NOT DETECTED NOT DETECTED Final   Cyclospora cayetanensis NOT DETECTED NOT DETECTED Final   Entamoeba histolytica NOT DETECTED NOT DETECTED Final   Giardia lamblia NOT DETECTED NOT DETECTED Final   Adenovirus F40/41 NOT DETECTED NOT DETECTED Final   Astrovirus NOT DETECTED NOT DETECTED Final   Norovirus GI/GII NOT DETECTED NOT DETECTED Final   Rotavirus A NOT DETECTED NOT DETECTED Final   Sapovirus (I, II, IV, and V) NOT DETECTED NOT DETECTED Final  C difficile quick scan w PCR reflex     Status: None   Collection Time: 03/10/17 10:39 PM  Result Value Ref Range Status   C Diff antigen NEGATIVE NEGATIVE Final   C Diff toxin NEGATIVE NEGATIVE Final   C Diff interpretation No C. difficile detected.  Final    Comment: VALID    Radiology Reports Ct Abdomen Pelvis W Contrast  Result Date: 03/11/2017 CLINICAL DATA:  Fever and right lower quadrant pain. Body aches, diarrhea and nausea for 2 days. EXAM: CT ABDOMEN AND PELVIS WITH CONTRAST TECHNIQUE: Multidetector CT imaging of the abdomen and pelvis was performed using the standard protocol following bolus administration of intravenous contrast. CONTRAST:  ISOVUE-300 IOPAMIDOL (ISOVUE-300) INJECTION 61% COMPARISON:  None. FINDINGS: Lower chest: Lung bases are clear. Hepatobiliary: No focal liver abnormality is seen. No gallstones, gallbladder wall thickening, or biliary dilatation. Pancreas:  No ductal dilatation or inflammation. Spleen: Normal in size without focal abnormality. Adrenals/Urinary Tract: Adrenal glands are unremarkable. Kidneys are normal, without renal calculi, focal lesion, or hydronephrosis. Symmetric renal excretion on delayed phase imaging per Bladder is unremarkable. Stomach/Bowel: Pan colonic wall thickening and enhancement of the entire colon. Mild pericolonic soft tissue edema involves the cecum and ascending colon. Liquid stool throughout the colon consistent with diarrheal process. The appendix is upper normal in size measuring 7 mm with without surrounding inflammatory change. Small bowel is decompressed without wall thickening or inflammation. Stomach is decompressed. Small hiatal hernia. Vascular/Lymphatic: Enlarged ileocolic lymph nodes largest measuring 11 mm, likely reactive. Small central mesenteric nodes. No retroperitoneal or pelvic adenopathy. Abdominal aorta is normal in caliber. Portal vein appears patent. Reproductive: Uterus and bilateral adnexa are unremarkable. Other: No free air, free fluid, or intra-abdominal fluid collection. Musculoskeletal: There are no acute or suspicious osseous abnormalities. IMPRESSION: 1. Pancolitis, may be infectious or inflammatory. Findings are most pronounced in the cecum and ascending colon. Adjacent ileocolic lymph nodes are likely reactive. 2. No evidence of appendicitis. Electronically Signed   By: Rubye Oaks M.D.   On: 03/11/2017 00:06   Dg Chest Port 1 View  Result Date: 03/10/2017 CLINICAL DATA:  41 year old female with fever. EXAM: PORTABLE CHEST 1 VIEW COMPARISON:  Chest radiograph dated 05/29/2016  FINDINGS: The heart size and mediastinal contours are within normal limits. Both lungs are clear. The visualized skeletal structures are unremarkable. IMPRESSION: No active disease. Electronically Signed   By: Elgie Collard M.D.   On: 03/10/2017 23:55     CBC  Recent Labs Lab 03/10/17 2142  WBC 10.3  HGB  10.5*  HCT 33.5*  PLT 321  MCV 67.3*  MCH 21.1*  MCHC 31.4*  RDW 19.2*  LYMPHSABS 0.9*  MONOABS 0.9  EOSABS 0.0  BASOSABS 0.1    Chemistries   Recent Labs Lab 03/10/17 2142  NA 134*  K 3.6  CL 98*  CO2 26  GLUCOSE 170*  BUN 8  CREATININE 0.92  CALCIUM 10.3  AST 36  ALT 13*  ALKPHOS 73  BILITOT 0.4   ------------------------------------------------------------------------------------------------------------------ estimated creatinine clearance is 95.7 mL/min (by C-G formula based on SCr of 0.92 mg/dL). ------------------------------------------------------------------------------------------------------------------ No results for input(s): HGBA1C in the last 72 hours. ------------------------------------------------------------------------------------------------------------------ No results for input(s): CHOL, HDL, LDLCALC, TRIG, CHOLHDL, LDLDIRECT in the last 72 hours. ------------------------------------------------------------------------------------------------------------------  Recent Labs  03/11/17 0359  TSH 2.123   ------------------------------------------------------------------------------------------------------------------ No results for input(s): VITAMINB12, FOLATE, FERRITIN, TIBC, IRON, RETICCTPCT in the last 72 hours.  Coagulation profile No results for input(s): INR, PROTIME in the last 168 hours.  No results for input(s): DDIMER in the last 72 hours.  Cardiac Enzymes No results for input(s): CKMB, TROPONINI, MYOGLOBIN in the last 168 hours.  Invalid input(s): CK ------------------------------------------------------------------------------------------------------------------ Invalid input(s): POCBNP    Assessment & Plan   This is a 41 year old female admitted for sepsis. 1. Sepsis due to severe colitis preliminary stool cultures positive for Escherichia coli as well as salmonella, patient reports eating chicken eggs which her friend  raises likely the risk factor she consumed this last Wednesday  2. Colitis appears to be severe, I have discussed with gastroenterology they will evaluate the patient may also recommend the ID and evaluation based on the stool studies, if symptoms worsen will need x-ray of the abdomen 3. GERD: Continue PPI per home regimen 4. DVT prophylaxis: Lovenox 5. GI prophylaxis: As above     Code Status Orders        Start     Ordered   03/11/17 0329  Full code  Continuous     03/11/17 0328    Code Status History    Date Active Date Inactive Code Status Order ID Comments User Context   This patient has a current code status but no historical code status.           Consults  Gastroenterology and infectious disease  DVT Prophylaxis  Lovenox   Lab Results  Component Value Date   PLT 321 03/10/2017     Time Spent in minutes   Greater than 50% of time spent in care coordination and counseling patient regarding the condition and plan of care.   Auburn Bilberry M.D on 03/11/2017 at 2:07 PM  Between 7am to 6pm - Pager - [phone]  After 6pm go to [email or web address] - password [email or web address] [facility]  [facility]   Office  [phone]

## 2017-03-11 NOTE — H&P (Signed)
Crystal Gordon is an 41 y.o. female.   Chief Complaint: Diarrhea HPI: The patient with past medical history of GERD presents to the emergency department complaining of diarrhea and abdominal pain. She states that her symptoms began 4 days ago and that she has had fever with maximum temperature of 102F. She has had nausea but no vomiting. She denies seeing blood in her stool. She came to the emergency department today due to weakness. CT of her abdomen revealed pancolitis. The patient also met criteria for sepsis and was started on broad-spectrum antibiotics prior to the emergency department staff calling the hospitalist service for admission.  Past Medical History:  Diagnosis Date  . ADHD (attention deficit hyperactivity disorder)   . Bipolar 1 disorder ([place])   . GERD (gastroesophageal reflux disease)     Past Surgical History:  Procedure Laterality Date  . EYE SURGERY      Family History  Problem Relation Age of Onset  . Diabetes Mellitus II Paternal Grandmother    Social History:  reports that she has quit smoking. She has never used smokeless tobacco. She reports that she drinks alcohol. She reports that she does not use drugs.  Allergies: No Known Allergies  Medications Prior to Admission  Medication Sig Dispense Refill  . omeprazole (PRILOSEC) 20 MG capsule Take 20 mg by mouth daily.      Results for orders placed or performed during the hospital encounter of 03/10/17 (from the past 48 hour(s))  Comprehensive metabolic panel     Status: Abnormal   Collection Time: 03/10/17  9:42 PM  Result Value Ref Range   Sodium 134 (L) 135 - 145 mmol/L   Potassium 3.6 3.5 - 5.1 mmol/L   Chloride 98 (L) 101 - 111 mmol/L   CO2 26 22 - 32 mmol/L   Glucose, Bld 170 (H) 65 - 99 mg/dL   BUN 8 6 - 20 mg/dL   Creatinine, Ser 0.92 0.44 - 1.00 mg/dL   Calcium 10.3 8.9 - 10.3 mg/dL   Total Protein 7.3 6.5 - 8.1 g/dL   Albumin 3.5 3.5 - 5.0 g/dL   AST 36 15 - 41 U/L   ALT 13 (L) 14 - 54 U/L    Alkaline Phosphatase 73 38 - 126 U/L   Total Bilirubin 0.4 0.3 - 1.2 mg/dL   GFR calc non Af Amer >60 >60 mL/min   GFR calc Af Amer >60 >60 mL/min    Comment: (NOTE) The eGFR has been calculated using the CKD EPI equation. This calculation has not been validated in all clinical situations. eGFR's persistently <60 mL/min signify possible Chronic Kidney Disease.    Anion gap 10 5 - 15  Lactic acid, plasma     Status: Abnormal   Collection Time: 03/10/17  9:42 PM  Result Value Ref Range   Lactic Acid, Venous 3.2 (HH) 0.5 - 1.9 mmol/L    Comment: CRITICAL RESULT CALLED TO, READ BACK BY AND VERIFIED WITH AMY COYNE AT 2223 ON 03/10/17 BY SNJ   CBC with Differential     Status: Abnormal   Collection Time: 03/10/17  9:42 PM  Result Value Ref Range   WBC 10.3 3.6 - 11.0 K/uL   RBC 4.97 3.80 - 5.20 MIL/uL   Hemoglobin 10.5 (L) 12.0 - 16.0 g/dL   HCT 33.5 (L) 35.0 - 47.0 %   MCV 67.3 (L) 80.0 - 100.0 fL   MCH 21.1 (L) 26.0 - 34.0 pg   MCHC 31.4 (L) 32.0 - 36.0 g/dL  RDW 19.2 (H) 11.5 - 14.5 %   Platelets 321 150 - 440 K/uL   Neutrophils Relative % 81 %   Neutro Abs 8.5 (H) 1.4 - 6.5 K/uL   Lymphocytes Relative 9 %   Lymphs Abs 0.9 (L) 1.0 - 3.6 K/uL   Monocytes Relative 9 %   Monocytes Absolute 0.9 0.2 - 0.9 K/uL   Eosinophils Relative 0 %   Eosinophils Absolute 0.0 0 - 0.7 K/uL   Basophils Relative 1 %   Basophils Absolute 0.1 0 - 0.1 K/uL  Urinalysis, Complete w Microscopic     Status: Abnormal   Collection Time: 03/10/17  9:42 PM  Result Value Ref Range   Color, Urine YELLOW YELLOW   APPearance CLEAR CLEAR   Specific Gravity, Urine 1.020 1.005 - 1.030   pH 5.5 5.0 - 8.0   Glucose, UA NEGATIVE NEGATIVE mg/dL   Hgb urine dipstick MODERATE (A) NEGATIVE   Bilirubin Urine NEGATIVE NEGATIVE   Ketones, ur NEGATIVE NEGATIVE mg/dL   Protein, ur 30 (A) NEGATIVE mg/dL   Nitrite NEGATIVE NEGATIVE   Leukocytes, UA NEGATIVE NEGATIVE   Squamous Epithelial / LPF 0-5 (A) NONE SEEN    WBC, UA 0-5 0 - 5 WBC/hpf   RBC / HPF 0-5 0 - 5 RBC/hpf   Bacteria, UA RARE (A) NONE SEEN   Mucous PRESENT   Pregnancy, urine POC     Status: None   Collection Time: 03/10/17  9:54 PM  Result Value Ref Range   Preg Test, Ur NEGATIVE NEGATIVE    Comment:        THE SENSITIVITY OF THIS METHODOLOGY IS >24 mIU/mL   Gastrointestinal Panel by PCR , Stool     Status: Abnormal   Collection Time: 03/10/17 10:39 PM  Result Value Ref Range   Campylobacter species NOT DETECTED NOT DETECTED   Plesimonas shigelloides NOT DETECTED NOT DETECTED   Salmonella species DETECTED (A) NOT DETECTED    Comment: RESULT CALLED TO, READ BACK BY AND VERIFIED WITH: REBECCA UHORCHUK @ 1937 ON 03/11/2017 BY CAF    Yersinia enterocolitica NOT DETECTED NOT DETECTED   Vibrio species NOT DETECTED NOT DETECTED   Vibrio cholerae NOT DETECTED NOT DETECTED   Enteroaggregative E coli (EAEC) NOT DETECTED NOT DETECTED   Enteropathogenic E coli (EPEC) DETECTED (A) NOT DETECTED    Comment: RESULT CALLED TO, READ BACK BY AND VERIFIED WITH: REBECCA UHORCHUK @ 9024 ON 03/11/2017 BY CAF    Enterotoxigenic E coli (ETEC) NOT DETECTED NOT DETECTED   Shiga like toxin producing E coli (STEC) NOT DETECTED NOT DETECTED   Shigella/Enteroinvasive E coli (EIEC) NOT DETECTED NOT DETECTED   Cryptosporidium NOT DETECTED NOT DETECTED   Cyclospora cayetanensis NOT DETECTED NOT DETECTED   Entamoeba histolytica NOT DETECTED NOT DETECTED   Giardia lamblia NOT DETECTED NOT DETECTED   Adenovirus F40/41 NOT DETECTED NOT DETECTED   Astrovirus NOT DETECTED NOT DETECTED   Norovirus GI/GII NOT DETECTED NOT DETECTED   Rotavirus A NOT DETECTED NOT DETECTED   Sapovirus (I, II, IV, and V) NOT DETECTED NOT DETECTED  C difficile quick scan w PCR reflex     Status: None   Collection Time: 03/10/17 10:39 PM  Result Value Ref Range   C Diff antigen NEGATIVE NEGATIVE   C Diff toxin NEGATIVE NEGATIVE   C Diff interpretation No C. difficile detected.      Comment: VALID  Lactic acid, plasma     Status: None   Collection Time: 03/11/17 12:54  AM  Result Value Ref Range   Lactic Acid, Venous 1.5 0.5 - 1.9 mmol/L   Ct Abdomen Pelvis W Contrast  Result Date: 03/11/2017 CLINICAL DATA:  Fever and right lower quadrant pain. Body aches, diarrhea and nausea for 2 days. EXAM: CT ABDOMEN AND PELVIS WITH CONTRAST TECHNIQUE: Multidetector CT imaging of the abdomen and pelvis was performed using the standard protocol following bolus administration of intravenous contrast. CONTRAST:  163m ISOVUE-300 IOPAMIDOL (ISOVUE-300) INJECTION 61% COMPARISON:  None. FINDINGS: Lower chest: Lung bases are clear. Hepatobiliary: No focal liver abnormality is seen. No gallstones, gallbladder wall thickening, or biliary dilatation. Pancreas: No ductal dilatation or inflammation. Spleen: Normal in size without focal abnormality. Adrenals/Urinary Tract: Adrenal glands are unremarkable. Kidneys are normal, without renal calculi, focal lesion, or hydronephrosis. Symmetric renal excretion on delayed phase imaging per Bladder is unremarkable. Stomach/Bowel: Pan colonic wall thickening and enhancement of the entire colon. Mild pericolonic soft tissue edema involves the cecum and ascending colon. Liquid stool throughout the colon consistent with diarrheal process. The appendix is upper normal in size measuring 7 mm with without surrounding inflammatory change. Small bowel is decompressed without wall thickening or inflammation. Stomach is decompressed. Small hiatal hernia. Vascular/Lymphatic: Enlarged ileocolic lymph nodes largest measuring 11 mm, likely reactive. Small central mesenteric nodes. No retroperitoneal or pelvic adenopathy. Abdominal aorta is normal in caliber. Portal vein appears patent. Reproductive: Uterus and bilateral adnexa are unremarkable. Other: No free air, free fluid, or intra-abdominal fluid collection. Musculoskeletal: There are no acute or suspicious osseous abnormalities.  IMPRESSION: 1. Pancolitis, may be infectious or inflammatory. Findings are most pronounced in the cecum and ascending colon. Adjacent ileocolic lymph nodes are likely reactive. 2. No evidence of appendicitis. Electronically Signed   By: MJeb LeveringM.D.   On: 03/11/2017 00:06   Dg Chest Port 1 View  Result Date: 03/10/2017 CLINICAL DATA:  41year old female with fever. EXAM: PORTABLE CHEST 1 VIEW COMPARISON:  Chest radiograph dated 05/29/2016 FINDINGS: The heart size and mediastinal contours are within normal limits. Both lungs are clear. The visualized skeletal structures are unremarkable. IMPRESSION: No active disease. Electronically Signed   By: AAnner CreteM.D.   On: 03/10/2017 23:55    Review of Systems  Constitutional: Negative for chills and fever.  HENT: Negative for sore throat and tinnitus.   Eyes: Negative for blurred vision and redness.  Respiratory: Negative for cough and shortness of breath.   Cardiovascular: Negative for chest pain, palpitations, orthopnea and PND.  Gastrointestinal: Positive for abdominal pain, diarrhea and nausea. Negative for vomiting.  Genitourinary: Negative for dysuria, frequency and urgency.  Musculoskeletal: Negative for joint pain and myalgias.  Skin: Negative for rash.       No lesions  Neurological: Negative for speech change, focal weakness and weakness.  Endo/Heme/Allergies: Does not bruise/bleed easily.       No temperature intolerance  Psychiatric/Behavioral: Negative for depression and suicidal ideas.    Blood pressure (!) 102/48, pulse (!) 104, temperature 100 F (37.8 C), temperature source Oral, resp. rate 14, height 5' 6.5" (1.689 m), weight 95.3 kg (210 lb), last menstrual period 02/24/2017, SpO2 99 %. Physical Exam  Vitals reviewed. Constitutional: She is oriented to person, place, and time. She appears well-developed and well-nourished. No distress.  HENT:  Head: Normocephalic and atraumatic.  Mouth/Throat: Oropharynx is  clear and moist.  Eyes: Conjunctivae and EOM are normal. Pupils are equal, round, and reactive to light. No scleral icterus.  Neck: Normal range of motion. Neck supple. No  JVD present. No tracheal deviation present. No thyromegaly present.  Cardiovascular: Normal rate, regular rhythm and normal heart sounds.  Exam reveals no gallop and no friction rub.   No murmur heard. Respiratory: Effort normal and breath sounds normal.  GI: Soft. Bowel sounds are normal. She exhibits no distension and no mass. There is tenderness. There is no rebound and no guarding.  Genitourinary:  Genitourinary Comments: Deferred  Musculoskeletal: Normal range of motion. She exhibits no edema.  Lymphadenopathy:    She has no cervical adenopathy.  Neurological: She is alert and oriented to person, place, and time. No cranial nerve deficit. She exhibits normal muscle tone.  Skin: Skin is warm and dry. No rash noted. No erythema.  Psychiatric: She has a normal mood and affect. Her behavior is normal. Judgment and thought content normal.     Assessment/Plan This is a 41 year old female admitted for sepsis. 1. Sepsis: The patient meets criteria via fever, tachycardia and intermittent tachypnea. She is hemodynamically stable. Continue vancomycin and Zosyn.  2. Colitis: Flagyl for anaerobic coverage. Clear liquid diet for now. Advance as tolerated. 3. GERD: Continue PPI per home regimen 4. DVT prophylaxis: Lovenox 5. GI prophylaxis: As above The patient is a full code. Time spent on admission orders and patient care approximately 45 minutes  Harrie Foreman, MD 03/11/2017, 5:00 AM

## 2017-03-12 LAB — HEMOGLOBIN A1C
Hgb A1c MFr Bld: 5.7 % — ABNORMAL HIGH (ref 4.8–5.6)
Mean Plasma Glucose: 117 mg/dL

## 2017-03-12 LAB — CREATININE, SERUM: Creatinine, Ser: 0.62 mg/dL (ref 0.44–1.00)

## 2017-03-12 MED ORDER — METRONIDAZOLE 500 MG PO TABS
500.0000 mg | ORAL_TABLET | Freq: Three times a day (TID) | ORAL | Status: DC
  Administered 2017-03-12 – 2017-03-13 (×4): 500 mg via ORAL
  Filled 2017-03-12 (×5): qty 1

## 2017-03-12 MED ORDER — KETOROLAC TROMETHAMINE 30 MG/ML IJ SOLN
30.0000 mg | Freq: Four times a day (QID) | INTRAMUSCULAR | Status: DC | PRN
  Administered 2017-03-12 – 2017-03-13 (×2): 30 mg via INTRAVENOUS
  Filled 2017-03-12 (×2): qty 1

## 2017-03-12 NOTE — Progress Notes (Signed)
[name] MD, MRCP(U.K) [address]  [address]  [place], [place] [address]  Main: [phone]    Crystal Gordon is being followed for salmonella and ecoli gastroenteritis  Day 2 of follow up   Subjective: Still has some nausea, dry heaves, no fever , abdominal pain is better, still having diarrhea 5-6 episodes since last evening .    Objective: Vital signs in last 24 hours: Vitals:   03/11/17 1202 03/11/17 1933 03/11/17 1939 03/12/17 0406  BP: (!) 108/58 (!) 85/44 127/76 111/64  Pulse: 83 87 91 89  Resp: 18 19  18   Temp: 97.9 F (36.6 C) 99 F (37.2 C)  98.4 F (36.9 C)  TempSrc: Oral Oral  Oral  SpO2: 98% 100%  99%  Weight:      Height:       Weight change:   Intake/Output Summary (Last 24 hours) at 03/12/17 0910 Last data filed at 03/12/17 0109  Gross per 24 hour  Intake           4335.5 ml  Output                0 ml  Net           4335.5 ml      Exam: Heart:: Regular rate and rhythm, S1S2 present or without murmur or extra heart sounds Lungs: normal, clear to auscultation and clear to auscultation and percussion Abdomen: soft, nontender, normal bowel sounds   Lab Results: @LABTEST2 @ Micro Results: Recent Results (from the past 240 hour(s))  Blood Culture (routine x 2)     Status: None (Preliminary result)   Collection Time: 03/10/17 10:39 PM  Result Value Ref Range Status   Specimen Description BLOOD LT [facility]  Final   Special Requests   Final    BOTTLES DRAWN AEROBIC AND ANAEROBIC Blood Culture adequate volume   Culture NO GROWTH 2 DAYS  Final   Report Status PENDING  Incomplete  Blood Culture (routine x 2)     Status: None (Preliminary result)   Collection Time: 03/10/17 10:39 PM  Result Value Ref Range Status   Specimen Description BLOOD RT [facility]  Final   Special Requests   Final    BOTTLES DRAWN AEROBIC AND ANAEROBIC Blood Culture adequate volume   Culture NO GROWTH 2 DAYS  Final   Report Status PENDING  Incomplete  Gastrointestinal Panel  by PCR , Stool     Status: Abnormal   Collection Time: 03/10/17 10:39 PM  Result Value Ref Range Status   Campylobacter species NOT DETECTED NOT DETECTED Final   Plesimonas shigelloides NOT DETECTED NOT DETECTED Final   Salmonella species DETECTED (A) NOT DETECTED Final    Comment: RESULT CALLED TO, READ BACK BY AND VERIFIED WITH: REBECCA UHORCHUK @ 0204 ON 03/11/2017 BY CAF    Yersinia enterocolitica NOT DETECTED NOT DETECTED Final   Vibrio species NOT DETECTED NOT DETECTED Final   Vibrio cholerae NOT DETECTED NOT DETECTED Final   Enteroaggregative E coli (EAEC) NOT DETECTED NOT DETECTED Final   Enteropathogenic E coli (EPEC) DETECTED (A) NOT DETECTED Final    Comment: RESULT CALLED TO, READ BACK BY AND VERIFIED WITH: REBECCA UHORCHUK @ 0204 ON 03/11/2017 BY CAF    Enterotoxigenic E coli (ETEC) NOT DETECTED NOT DETECTED Final   Shiga like toxin producing E coli (STEC) NOT DETECTED NOT DETECTED Final   Shigella/Enteroinvasive E coli (EIEC) NOT DETECTED NOT DETECTED Final   Cryptosporidium NOT DETECTED NOT DETECTED Final  Cyclospora cayetanensis NOT DETECTED NOT DETECTED Final   Entamoeba histolytica NOT DETECTED NOT DETECTED Final   Giardia lamblia NOT DETECTED NOT DETECTED Final   Adenovirus F40/41 NOT DETECTED NOT DETECTED Final   Astrovirus NOT DETECTED NOT DETECTED Final   Norovirus GI/GII NOT DETECTED NOT DETECTED Final   Rotavirus A NOT DETECTED NOT DETECTED Final   Sapovirus (I, II, IV, and V) NOT DETECTED NOT DETECTED Final  C difficile quick scan w PCR reflex     Status: None   Collection Time: 03/10/17 10:39 PM  Result Value Ref Range Status   C Diff antigen NEGATIVE NEGATIVE Final   C Diff toxin NEGATIVE NEGATIVE Final   C Diff interpretation No C. difficile detected.  Final    Comment: VALID   Studies/Results: Dg Abd 1 View  Result Date: 03/11/2017 CLINICAL DATA:  Abdominal pain and distension. Diagnosed with colitis 1 day prior. EXAM: ABDOMEN - 1 VIEW COMPARISON:   CT abdomen and pelvis March 10, 2017 FINDINGS: Mild gas distended small bowel to 3 cm, paucity of large bowel gas. No intra-abdominal mass effect or pathologic calcifications. Mildly dense urinary bladder most compatible with contrast excretion. Soft tissue planes and included osseous structures are nonsuspicious. IMPRESSION: Nonspecific bowel gas pattern, paucity of large bowel gas. Electronically Signed   By: Awilda Metroourtnay  Bloomer M.D.   On: 03/11/2017 16:56   Ct Abdomen Pelvis W Contrast  Result Date: 03/11/2017 CLINICAL DATA:  Fever and right lower quadrant pain. Body aches, diarrhea and nausea for 2 days. EXAM: CT ABDOMEN AND PELVIS WITH CONTRAST TECHNIQUE: Multidetector CT imaging of the abdomen and pelvis was performed using the standard protocol following bolus administration of intravenous contrast. CONTRAST:  100mL ISOVUE-300 IOPAMIDOL (ISOVUE-300) INJECTION 61% COMPARISON:  None. FINDINGS: Lower chest: Lung bases are clear. Hepatobiliary: No focal liver abnormality is seen. No gallstones, gallbladder wall thickening, or biliary dilatation. Pancreas: No ductal dilatation or inflammation. Spleen: Normal in size without focal abnormality. Adrenals/Urinary Tract: Adrenal glands are unremarkable. Kidneys are normal, without renal calculi, focal lesion, or hydronephrosis. Symmetric renal excretion on delayed phase imaging per Bladder is unremarkable. Stomach/Bowel: Pan colonic wall thickening and enhancement of the entire colon. Mild pericolonic soft tissue edema involves the cecum and ascending colon. Liquid stool throughout the colon consistent with diarrheal process. The appendix is upper normal in size measuring 7 mm with without surrounding inflammatory change. Small bowel is decompressed without wall thickening or inflammation. Stomach is decompressed. Small hiatal hernia. Vascular/Lymphatic: Enlarged ileocolic lymph nodes largest measuring 11 mm, likely reactive. Small central mesenteric nodes. No  retroperitoneal or pelvic adenopathy. Abdominal aorta is normal in caliber. Portal vein appears patent. Reproductive: Uterus and bilateral adnexa are unremarkable. Other: No free air, free fluid, or intra-abdominal fluid collection. Musculoskeletal: There are no acute or suspicious osseous abnormalities. IMPRESSION: 1. Pancolitis, may be infectious or inflammatory. Findings are most pronounced in the cecum and ascending colon. Adjacent ileocolic lymph nodes are likely reactive. 2. No evidence of appendicitis. Electronically Signed   By: Rubye OaksMelanie  Ehinger M.D.   On: 03/11/2017 00:06   Dg Chest Port 1 View  Result Date: 03/10/2017 CLINICAL DATA:  41 year old female with fever. EXAM: PORTABLE CHEST 1 VIEW COMPARISON:  Chest radiograph dated 05/29/2016 FINDINGS: The heart size and mediastinal contours are within normal limits. Both lungs are clear. The visualized skeletal structures are unremarkable. IMPRESSION: No active disease. Electronically Signed   By: Elgie CollardArash  Radparvar M.D.   On: 03/10/2017 23:55   Medications: I have reviewed the  patient's current medications. Scheduled Meds: . ciprofloxacin  500 mg Oral BID  . docusate sodium  100 mg Oral BID  . enoxaparin (LOVENOX) injection  40 mg Subcutaneous Q24H  . mouth rinse  15 mL Mouth Rinse BID  . pantoprazole  40 mg Oral Daily   Continuous Infusions: . sodium chloride 150 mL/hr at 03/12/17 0109  . metronidazole Stopped (03/12/17 0631)   PRN Meds:.acetaminophen **OR** acetaminophen, alum & mag hydroxide-simeth, ketorolac, morphine injection, ondansetron **OR** ondansetron (ZOFRAN) IV   Assessment: Active Problems:   Sepsis (HCC)  Crystal Gordon is a 41 y.o. y/o female with presented to the hospital with acute salmonella gastroenteritis . Stool PCR is positive for both enteropathogenic ecoli as well as salmonella.   Plan  1.On ciprofloxacin  2. F/u stool for  e coli 0157   3.ID consult   4. She has microcytic iron deficiency anemia-  oral iron can be commenced at discharge and needs out patient EGD+colonoscopy   5. Serial abdominal exam. If pain increases or has tachycardia watch for toxic megacolon- get abdominal imaging and possible surgery involved if imaging shows toxic megacolon.   6. Gradually advance diet as she tolerates it better    LOS: 1 day   [name] 03/12/2017, 9:10 AM

## 2017-03-12 NOTE — Progress Notes (Signed)
PHARMACIST - PHYSICIAN COMMUNICATION DR:   Allena KatzPatel CONCERNING: Antibiotic IV to Oral Route Change Policy  RECOMMENDATION: This patient is receiving metronidazole and ciprofloxacin by the intravenous route.  Based on criteria approved by the Pharmacy and Therapeutics Committee, the antibiotic(s) is/are being converted to the equivalent oral dose form(s).   DESCRIPTION: These criteria include:  Patient being treated for a respiratory tract infection, urinary tract infection, cellulitis or clostridium difficile associated diarrhea if on metronidazole  The patient is not neutropenic and does not exhibit a GI malabsorption state  The patient is eating (either orally or via tube) and/or has been taking other orally administered medications for a least 24 hours  The patient is improving clinically and has a Tmax < 100.5  If you have questions about this conversion, please contact the Pharmacy Department   Cindi CarbonMary M Abagail Limb, PharmD 03/12/17 2:17 PM

## 2017-03-12 NOTE — Progress Notes (Signed)
[employer] [facility] [place] [facility]                                                                                                                                                                                  Patient Demographics   Crystal Gordon, is a 41 y.o. female, DOB - 23-Dec-1975, ZOX:[id number]  Admit date - 03/10/2017   Admitting Physician Arnaldo Natal, MD  Outpatient Primary MD for the patient is Patient, No Pcp Per   LOS - 1  Subjective: Pt still having diarrhea, abdominal pain less  Review of Systems:   CONSTITUTIONAL:Positive fever. No fatigue, weakness. No weight gain, no weight loss.  EYES: No blurry or double vision.  ENT: No tinnitus. No postnasal drip. No redness of the oropharynx.  RESPIRATORY: No cough, no wheeze, no hemoptysis. No dyspnea.  CARDIOVASCULAR: No chest pain. No orthopnea. No palpitations. No syncope.  GASTROINTESTINAL: No nausea, no vomiting or Positive diarrhea. Positive abdominal pain. No melena or hematochezia.  GENITOURINARY: No dysuria or hematuria.  ENDOCRINE: No polyuria or nocturia. No heat or cold intolerance.  HEMATOLOGY: No anemia. No bruising. No bleeding.  INTEGUMENTARY: No rashes. No lesions.  MUSCULOSKELETAL: No arthritis. No swelling. No gout.  NEUROLOGIC: No numbness, tingling, or ataxia. No seizure-type activity.  PSYCHIATRIC: No anxiety. No insomnia. No ADD.    Vitals:   Vitals:   03/11/17 1939 03/12/17 0406 03/12/17 0800 03/12/17 1231  BP: 127/76 111/64  112/61  Pulse: 91 89  84  Resp:  18  18  Temp:  98.4 F (36.9 C)    TempSrc:  Oral    SpO2:  99%  99%  Weight:   208 lb 8 oz (94.6 kg)   Height:        Wt Readings from Last 3 Encounters:  03/12/17 208 lb 8 oz (94.6 kg)  05/29/16 205 lb (93 kg)  05/11/16 206 lb (93.4 kg)     Intake/Output Summary (Last 24 hours) at 03/12/17 1255 Last data filed at 03/12/17 0109  Gross per 24 hour  Intake           4335.5 ml  Output                0 ml   Net           4335.5 ml    Physical Exam:   GENERAL: Pleasant-appearing in no apparent distress.  HEAD, EYES, EARS, NOSE AND THROAT: Atraumatic, normocephalic. Extraocular muscles are intact. Pupils equal and reactive to light. Sclerae anicteric. No conjunctival injection. No oro-pharyngeal erythema.  NECK: Supple. There is no jugular venous distention. No bruits, no lymphadenopathy, no thyromegaly.  HEART: Regular  rate and rhythm,. No murmurs, no rubs, no clicks.  LUNGS: Clear to auscultation bilaterally. No rales or rhonchi. No wheezes.  ABDOMEN: Distended , has lower abdominal tenderness without guarding or rebound positive bowel sounds EXTREMITIES: No evidence of any cyanosis, clubbing, or peripheral edema.  +2 pedal and radial pulses bilaterally.  NEUROLOGIC: The patient is alert, awake, and oriented x3 with no focal motor or sensory deficits appreciated bilaterally.  SKIN: Moist and warm with no rashes appreciated.  Psych: Not anxious, depressed LN: No inguinal LN enlargement    Antibiotics   Anti-infectives    Start     Dose/Rate Route Frequency Ordered Stop   03/11/17 1800  ciprofloxacin (CIPRO) tablet 500 mg     500 mg Oral 2 times daily 03/11/17 1551     03/11/17 1700  ciprofloxacin (CIPRO) IVPB 400 mg  Status:  Discontinued     400 mg 200 mL/hr over 60 Minutes Intravenous Every 12 hours 03/11/17 1543 03/11/17 1551   03/11/17 0800  piperacillin-tazobactam (ZOSYN) IVPB 3.375 g  Status:  Discontinued     3.375 g 12.5 mL/hr over 240 Minutes Intravenous Every 8 hours 03/11/17 0126 03/11/17 1540   03/11/17 0600  vancomycin (VANCOCIN) 1,250 mg in sodium chloride 0.9 % 250 mL IVPB  Status:  Discontinued     1,250 mg 166.7 mL/hr over 90 Minutes Intravenous Every 8 hours 03/11/17 0125 03/11/17 1540   03/11/17 0330  metroNIDAZOLE (FLAGYL) IVPB 500 mg     500 mg 100 mL/hr over 60 Minutes Intravenous Every 8 hours 03/11/17 0328     03/10/17 2330  piperacillin-tazobactam (ZOSYN)  IVPB 3.375 g     3.375 g 100 mL/hr over 30 Minutes Intravenous  Once 03/10/17 2327 03/11/17 0015   03/10/17 2330  vancomycin (VANCOCIN) IVPB 1000 mg/200 mL premix     1,000 mg 200 mL/hr over 60 Minutes Intravenous  Once 03/10/17 2327 03/11/17 0101      Medications   Scheduled Meds: . ciprofloxacin  500 mg Oral BID  . docusate sodium  100 mg Oral BID  . enoxaparin (LOVENOX) injection  40 mg Subcutaneous Q24H  . mouth rinse  15 mL Mouth Rinse BID  . pantoprazole  40 mg Oral Daily   Continuous Infusions: . sodium chloride 150 mL/hr at 03/12/17 0934  . metronidazole Stopped (03/12/17 1133)   PRN Meds:.acetaminophen **OR** acetaminophen, alum & mag hydroxide-simeth, ketorolac, morphine injection, ondansetron **OR** ondansetron (ZOFRAN) IV   Data Review:   Micro Results Recent Results (from the past 240 hour(s))  Blood Culture (routine x 2)     Status: None (Preliminary result)   Collection Time: 03/10/17 10:39 PM  Result Value Ref Range Status   Specimen Description [facility]  Final   Special Requests   Final    BOTTLES DRAWN AEROBIC AND ANAEROBIC Blood Culture adequate volume   Culture NO GROWTH 2 DAYS  Final   Report Status PENDING  Incomplete  Blood Culture (routine x 2)     Status: None (Preliminary result)   Collection Time: 03/10/17 10:39 PM  Result Value Ref Range Status   Specimen Description BLOOD RT AC  Final   Special Requests   Final    BOTTLES DRAWN AEROBIC AND ANAEROBIC Blood Culture adequate volume   Culture NO GROWTH 2 DAYS  Final   Report Status PENDING  Incomplete  Gastrointestinal Panel by PCR , Stool     Status: Abnormal   Collection Time: 03/10/17 10:39 PM  Result Value Ref  Range Status   Campylobacter species NOT DETECTED NOT DETECTED Final   Plesimonas shigelloides NOT DETECTED NOT DETECTED Final   Salmonella species DETECTED (A) NOT DETECTED Final    Comment: RESULT CALLED TO, READ BACK BY AND VERIFIED WITH: REBECCA UHORCHUK @ 0204 ON 03/11/2017  BY CAF    Yersinia enterocolitica NOT DETECTED NOT DETECTED Final   Vibrio species NOT DETECTED NOT DETECTED Final   Vibrio cholerae NOT DETECTED NOT DETECTED Final   Enteroaggregative E coli (EAEC) NOT DETECTED NOT DETECTED Final   Enteropathogenic E coli (EPEC) DETECTED (A) NOT DETECTED Final    Comment: RESULT CALLED TO, READ BACK BY AND VERIFIED WITH: REBECCA UHORCHUK @ 0204 ON 03/11/2017 BY CAF    Enterotoxigenic E coli (ETEC) NOT DETECTED NOT DETECTED Final   Shiga like toxin producing E coli (STEC) NOT DETECTED NOT DETECTED Final   Shigella/Enteroinvasive E coli (EIEC) NOT DETECTED NOT DETECTED Final   Cryptosporidium NOT DETECTED NOT DETECTED Final   Cyclospora cayetanensis NOT DETECTED NOT DETECTED Final   Entamoeba histolytica NOT DETECTED NOT DETECTED Final   Giardia lamblia NOT DETECTED NOT DETECTED Final   Adenovirus F40/41 NOT DETECTED NOT DETECTED Final   Astrovirus NOT DETECTED NOT DETECTED Final   Norovirus GI/GII NOT DETECTED NOT DETECTED Final   Rotavirus A NOT DETECTED NOT DETECTED Final   Sapovirus (I, II, IV, and V) NOT DETECTED NOT DETECTED Final  C difficile quick scan w PCR reflex     Status: None   Collection Time: 03/10/17 10:39 PM  Result Value Ref Range Status   C Diff antigen NEGATIVE NEGATIVE Final   C Diff toxin NEGATIVE NEGATIVE Final   C Diff interpretation No C. difficile detected.  Final    Comment: VALID    Radiology Reports Dg Abd 1 View  Result Date: 03/11/2017 CLINICAL DATA:  Abdominal pain and distension. Diagnosed with colitis 1 day prior. EXAM: ABDOMEN - 1 VIEW COMPARISON:  CT abdomen and pelvis March 10, 2017 FINDINGS: Mild gas distended small bowel to 3 cm, paucity of large bowel gas. No intra-abdominal mass effect or pathologic calcifications. Mildly dense urinary bladder most compatible with contrast excretion. Soft tissue planes and included osseous structures are nonsuspicious. IMPRESSION: Nonspecific bowel gas pattern, paucity of large  bowel gas. Electronically Signed   By: Awilda Metro M.D.   On: 03/11/2017 16:56   Ct Abdomen Pelvis W Contrast  Result Date: 03/11/2017 CLINICAL DATA:  Fever and right lower quadrant pain. Body aches, diarrhea and nausea for 2 days. EXAM: CT ABDOMEN AND PELVIS WITH CONTRAST TECHNIQUE: Multidetector CT imaging of the abdomen and pelvis was performed using the standard protocol following bolus administration of intravenous contrast. CONTRAST:  ISOVUE-300 IOPAMIDOL (ISOVUE-300) INJECTION 61% COMPARISON:  None. FINDINGS: Lower chest: Lung bases are clear. Hepatobiliary: No focal liver abnormality is seen. No gallstones, gallbladder wall thickening, or biliary dilatation. Pancreas: No ductal dilatation or inflammation. Spleen: Normal in size without focal abnormality. Adrenals/Urinary Tract: Adrenal glands are unremarkable. Kidneys are normal, without renal calculi, focal lesion, or hydronephrosis. Symmetric renal excretion on delayed phase imaging per Bladder is unremarkable. Stomach/Bowel: Pan colonic wall thickening and enhancement of the entire colon. Mild pericolonic soft tissue edema involves the cecum and ascending colon. Liquid stool throughout the colon consistent with diarrheal process. The appendix is upper normal in size measuring 7 mm with without surrounding inflammatory change. Small bowel is decompressed without wall thickening or inflammation. Stomach is decompressed. Small hiatal hernia. Vascular/Lymphatic: Enlarged ileocolic lymph nodes  largest measuring 11 mm, likely reactive. Small central mesenteric nodes. No retroperitoneal or pelvic adenopathy. Abdominal aorta is normal in caliber. Portal vein appears patent. Reproductive: Uterus and bilateral adnexa are unremarkable. Other: No free air, free fluid, or intra-abdominal fluid collection. Musculoskeletal: There are no acute or suspicious osseous abnormalities. IMPRESSION: 1. Pancolitis, may be infectious or inflammatory. Findings are  most pronounced in the cecum and ascending colon. Adjacent ileocolic lymph nodes are likely reactive. 2. No evidence of appendicitis. Electronically Signed   By: Rubye Oaks M.D.   On: 03/11/2017 00:06   Dg Chest Port 1 View  Result Date: 03/10/2017 CLINICAL DATA:  40 year old female with fever. EXAM: PORTABLE CHEST 1 VIEW COMPARISON:  Chest radiograph dated 05/29/2016 FINDINGS: The heart size and mediastinal contours are within normal limits. Both lungs are clear. The visualized skeletal structures are unremarkable. IMPRESSION: No active disease. Electronically Signed   By: Elgie Collard M.D.   On: 03/10/2017 23:55     CBC  Recent Labs Lab 03/10/17 2142  WBC 10.3  HGB 10.5*  HCT 33.5*  PLT 321  MCV 67.3*  MCH 21.1*  MCHC 31.4*  RDW 19.2*  LYMPHSABS 0.9*  MONOABS 0.9  EOSABS 0.0  BASOSABS 0.1    Chemistries   Recent Labs Lab 03/10/17 2142 03/12/17 0325  NA 134*  --   K 3.6  --   CL 98*  --   CO2 26  --   GLUCOSE 170*  --   BUN 8  --   CREATININE 0.92 0.62  CALCIUM 10.3  --   AST 36  --   ALT 13*  --   ALKPHOS 73  --   BILITOT 0.4  --    ------------------------------------------------------------------------------------------------------------------ estimated creatinine clearance is 108.3 mL/min (by C-G formula based on SCr of 0.62 mg/dL). ------------------------------------------------------------------------------------------------------------------  Recent Labs  03/11/17 0359  HGBA1C 5.7*   ------------------------------------------------------------------------------------------------------------------ No results for input(s): CHOL, HDL, LDLCALC, TRIG, CHOLHDL, LDLDIRECT in the last 72 hours. ------------------------------------------------------------------------------------------------------------------  Recent Labs  03/11/17 0359  TSH 2.123    ------------------------------------------------------------------------------------------------------------------  Recent Labs  03/11/17 0359  FERRITIN 29  TIBC 309  IRON <5*    Coagulation profile No results for input(s): INR, PROTIME in the last 168 hours.  No results for input(s): DDIMER in the last 72 hours.  Cardiac Enzymes No results for input(s): CKMB, TROPONINI, MYOGLOBIN in the last 168 hours.  Invalid input(s): CK ------------------------------------------------------------------------------------------------------------------ Invalid input(s): POCBNP    Assessment & Plan   This is a 41 year old female admitted for sepsis. 1. Sepsis due to severe colitis   stool cultures positive for Escherichia coli as well as salmonella, patient reports eating chicken eggs which her friend raises likely the risk factor she consumed this last Wednesday  2. Colitis appears to be severe, some proven in symptoms continue supportive care and anabiotic start patient back on clear liquid diet 3. GERD: Continue PPI per home regimen 4. DVT prophylaxis: Lovenox 5. GI prophylaxis: As above     Code Status Orders        Start     Ordered   03/11/17 0329  Full code  Continuous     03/11/17 0328    Code Status History    Date Active Date Inactive Code Status Order ID Comments User Context   This patient has a current code status but no historical code status.           Consults  Gastroenterology and infectious disease  DVT Prophylaxis  Lovenox  Lab Results  Component Value Date   PLT 321 03/10/2017     Time Spent in minutes   Greater than 50% of time spent in care coordination and counseling patient regarding the condition and plan of care.   Auburn Bilberry M.D on 03/12/2017 at 12:55 PM  Between 7am to 6pm - Pager - [phone]  After 6pm go to [email or web address] - password [email or web address] [facility]  [facility] [place] [facility]   Office  [phone]

## 2017-03-12 NOTE — Care Management (Signed)
Admitted to [facility] with the diagnosis of sepsis. Lives with husband, Barbara CowerJason [phone]). Prescriptions [facility] at [facility] [address]. 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 No insurance. No primary care physician. States her husband makes a little too much money for benefits.  [place]. [facility] information given. Discussed that she would need to call the Department of Social Services for [place] Romanita Fager S Alece Koppel RN MSN CCM Care Management [phone]

## 2017-03-12 NOTE — Progress Notes (Signed)
[facility] Date of Admission:  03/10/2017     ID: Crystal MortonHolly Gordon is a 41 y.o. 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 Active Problems:   Sepsis (HCC)   Subjective: A little less pain, no fevers, still some loose stools  ROS  Eleven systems are reviewed and negative except per hpi  Medications:  Antibiotics Given (last 72 hours)    Date/Time Action Medication Dose Rate   03/10/17 2345 New Bag/Given   piperacillin-tazobactam (ZOSYN) IVPB 3.375 g 3.375 g 100 mL/hr   03/10/17 2358 New Bag/Given   vancomycin (VANCOCIN) IVPB 1000 mg/200 mL premix 1,000 mg 200 mL/hr   03/11/17 0424 New Bag/Given   metroNIDAZOLE (FLAGYL) IVPB 500 mg 500 mg 100 mL/hr   03/11/17 0655 New Bag/Given   vancomycin (VANCOCIN) 1,250 mg in sodium chloride 0.9 % 250 mL IVPB 1,250 mg 166.7 mL/hr   03/11/17 0752 New Bag/Given   piperacillin-tazobactam (ZOSYN) IVPB 3.375 g 3.375 g 12.5 mL/hr   03/11/17 1050 New Bag/Given   metroNIDAZOLE (FLAGYL) IVPB 500 mg 500 mg 100 mL/hr   03/11/17 1429 New Bag/Given   piperacillin-tazobactam (ZOSYN) IVPB 3.375 g 3.375 g 12.5 mL/hr   03/11/17 1430 New Bag/Given   vancomycin (VANCOCIN) 1,250 mg in sodium chloride 0.9 % 250 mL IVPB 1,250 mg 166.7 mL/hr   03/11/17 1711 Given   ciprofloxacin (CIPRO) tablet 500 mg 500 mg    03/11/17 1959 New Bag/Given   metroNIDAZOLE (FLAGYL) IVPB 500 mg 500 mg 100 mL/hr   03/12/17 0412 New Bag/Given   metroNIDAZOLE (FLAGYL) IVPB 500 mg 500 mg 100 mL/hr   03/12/17 [id number] Given   ciprofloxacin (CIPRO) tablet 500 mg 500 mg    03/12/17 1033 New Bag/Given   metroNIDAZOLE (FLAGYL) IVPB 500 mg 500 mg 100 mL/hr     . ciprofloxacin  500 mg Oral BID  . docusate sodium  100 mg Oral BID  . enoxaparin (LOVENOX) injection  40 mg Subcutaneous Q24H  . mouth rinse  15 mL Mouth Rinse BID  . pantoprazole  40 mg Oral Daily    Objective: Vital signs in last 24 hours: Temp:  [97.9 F (36.6 C)-99 F (37.2 C)] 98.4 F (36.9 C)  (06/21 0406) Pulse Rate:  [83-91] 89 (06/21 0406) Resp:  [18-19] 18 (06/21 0406) BP: (85-127)/(44-76) 111/64 (06/21 0406) SpO2:  [98 %-100 %] 99 % (06/21 0406) Weight:  [94.6 kg (208 lb 8 oz)] 94.6 kg (208 lb 8 oz) (06/21 0800) Constitutional:  oriented to person, place, and time. Obese, lying in bed, mod ill appearing. HENT: [place]/AT, PERRLA, no scleral icterus Mouth/Throat: Oropharynx is clear and moist. No oropharyngeal exudate.  Cardiovascular: Normal rate, regular rhythm and normal heart sounds. Pulmonary/Chest: Effort normal and breath sounds normal. No respiratory distress.  has no wheezes.  Neck = supple, no nuchal rigidity Abdominal: Soft. No distention , mild ttp diffuse, mainly RLQ Lymphadenopathy: no cervical adenopathy. No axillary adenopathy Neurological: alert and oriented to person, place, and time.  Skin: Skin is warm and dry. No rash noted. No erythema.  Psychiatric: a normal mood and affect.  behavior is normal.   Lab Results  Recent Labs  03/10/17 2142 03/12/17 0325  WBC 10.3  --   HGB 10.5*  --   HCT 33.5*  --   NA 134*  --   K 3.6  --   CL 98*  --   CO2 26  --   BUN 8  --   CREATININE 0.92 0.62  Microbiology: Results for orders placed or performed during the hospital encounter of 03/10/17  Blood Culture (routine x 2)     Status: None (Preliminary result)   Collection Time: 03/10/17 10:39 PM  Result Value Ref Range Status   Specimen Description BLOOD LT [facility]  Final   Special Requests   Final    BOTTLES DRAWN AEROBIC AND ANAEROBIC Blood Culture adequate volume   Culture NO GROWTH 2 DAYS  Final   Report Status PENDING  Incomplete  Blood Culture (routine x 2)     Status: None (Preliminary result)   Collection Time: 03/10/17 10:39 PM  Result Value Ref Range Status   Specimen Description BLOOD RT [facility]  Final   Special Requests   Final    BOTTLES DRAWN AEROBIC AND ANAEROBIC Blood Culture adequate volume   Culture NO GROWTH 2 DAYS  Final   Report Status  PENDING  Incomplete  Gastrointestinal Panel by PCR , Stool     Status: Abnormal   Collection Time: 03/10/17 10:39 PM  Result Value Ref Range Status   Campylobacter species NOT DETECTED NOT DETECTED Final   Plesimonas shigelloides NOT DETECTED NOT DETECTED Final   Salmonella species DETECTED (A) NOT DETECTED Final    Comment: RESULT CALLED TO, READ BACK BY AND VERIFIED WITH: REBECCA UHORCHUK @ 0204 ON 03/11/2017 BY CAF    Yersinia enterocolitica NOT DETECTED NOT DETECTED Final   Vibrio species NOT DETECTED NOT DETECTED Final   Vibrio cholerae NOT DETECTED NOT DETECTED Final   Enteroaggregative E coli (EAEC) NOT DETECTED NOT DETECTED Final   Enteropathogenic E coli (EPEC) DETECTED (A) NOT DETECTED Final    Comment: RESULT CALLED TO, READ BACK BY AND VERIFIED WITH: REBECCA UHORCHUK @ 0204 ON 03/11/2017 BY CAF    Enterotoxigenic E coli (ETEC) NOT DETECTED NOT DETECTED Final   Shiga like toxin producing E coli (STEC) NOT DETECTED NOT DETECTED Final   Shigella/Enteroinvasive E coli (EIEC) NOT DETECTED NOT DETECTED Final   Cryptosporidium NOT DETECTED NOT DETECTED Final   Cyclospora cayetanensis NOT DETECTED NOT DETECTED Final   Entamoeba histolytica NOT DETECTED NOT DETECTED Final   Giardia lamblia NOT DETECTED NOT DETECTED Final   Adenovirus F40/41 NOT DETECTED NOT DETECTED Final   Astrovirus NOT DETECTED NOT DETECTED Final   Norovirus GI/GII NOT DETECTED NOT DETECTED Final   Rotavirus A NOT DETECTED NOT DETECTED Final   Sapovirus (I, II, IV, and V) NOT DETECTED NOT DETECTED Final  C difficile quick scan w PCR reflex     Status: None   Collection Time: 03/10/17 10:39 PM  Result Value Ref Range Status   C Diff antigen NEGATIVE NEGATIVE Final   C Diff toxin NEGATIVE NEGATIVE Final   C Diff interpretation No C. difficile detected.  Final    Comment: VALID    Studies/Results: Dg Abd 1 View  Result Date: 03/11/2017 CLINICAL DATA:  Abdominal pain and distension. Diagnosed with colitis 1  day prior. EXAM: ABDOMEN - 1 VIEW COMPARISON:  CT abdomen and pelvis March 10, 2017 FINDINGS: Mild gas distended small bowel to 3 cm, paucity of large bowel gas. No intra-abdominal mass effect or pathologic calcifications. Mildly dense urinary bladder most compatible with contrast excretion. Soft tissue planes and included osseous structures are nonsuspicious. IMPRESSION: Nonspecific bowel gas pattern, paucity of large bowel gas. Electronically Signed   By: Awilda Metro M.D.   On: 03/11/2017 16:56   Ct Abdomen Pelvis W Contrast  Result Date: 03/11/2017 CLINICAL DATA:  Fever and right  lower quadrant pain. Body aches, diarrhea and nausea for 2 days. EXAM: CT ABDOMEN AND PELVIS WITH CONTRAST TECHNIQUE: Multidetector CT imaging of the abdomen and pelvis was performed using the standard protocol following bolus administration of intravenous contrast. CONTRAST:  ISOVUE-300 IOPAMIDOL (ISOVUE-300) INJECTION 61% COMPARISON:  None. FINDINGS: Lower chest: Lung bases are clear. Hepatobiliary: No focal liver abnormality is seen. No gallstones, gallbladder wall thickening, or biliary dilatation. Pancreas: No ductal dilatation or inflammation. Spleen: Normal in size without focal abnormality. Adrenals/Urinary Tract: Adrenal glands are unremarkable. Kidneys are normal, without renal calculi, focal lesion, or hydronephrosis. Symmetric renal excretion on delayed phase imaging per Bladder is unremarkable. Stomach/Bowel: Pan colonic wall thickening and enhancement of the entire colon. Mild pericolonic soft tissue edema involves the cecum and ascending colon. Liquid stool throughout the colon consistent with diarrheal process. The appendix is upper normal in size measuring 7 mm with without surrounding inflammatory change. Small bowel is decompressed without wall thickening or inflammation. Stomach is decompressed. Small hiatal hernia. Vascular/Lymphatic: Enlarged ileocolic lymph nodes largest measuring 11 mm, likely  reactive. Small central mesenteric nodes. No retroperitoneal or pelvic adenopathy. Abdominal aorta is normal in caliber. Portal vein appears patent. Reproductive: Uterus and bilateral adnexa are unremarkable. Other: No free air, free fluid, or intra-abdominal fluid collection. Musculoskeletal: There are no acute or suspicious osseous abnormalities. IMPRESSION: 1. Pancolitis, may be infectious or inflammatory. Findings are most pronounced in the cecum and ascending colon. Adjacent ileocolic lymph nodes are likely reactive. 2. No evidence of appendicitis. Electronically Signed   By: Rubye Oaks M.D.   On: 03/11/2017 00:06   Dg Chest Port 1 View  Result Date: 03/10/2017 CLINICAL DATA:  42 year old female with fever. EXAM: PORTABLE CHEST 1 VIEW COMPARISON:  Chest radiograph dated 05/29/2016 FINDINGS: The heart size and mediastinal contours are within normal limits. Both lungs are clear. The visualized skeletal structures are unremarkable. IMPRESSION: No active disease. Electronically Signed   By: Elgie Collard M.D.   On: 03/10/2017 23:55    Assessment/Plan: Crystal Gordon is a 41 y.o. female admitted for acute onset fever/chills and diarrhea. 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  BCX neg.  Is now on cipro and has slow improvement.  Recommendations Continue cipro - would suggest a 7 day course Continue supportive care. When able to maintain hydration could DC home. Spoke with her regarding not returning to work at [employer] until a weak after diarrhea resolved. Discussed hand hygiene as well.  Thank you very much for the consult. Will follow with you.  Crystal Gordon   03/12/2017, 11:18 AM

## 2017-03-13 MED ORDER — CIPROFLOXACIN HCL 500 MG PO TABS: 500.0000 mg | ORAL_TABLET | Freq: Two times a day (BID) | ORAL | 0 refills | Status: AC

## 2017-03-13 MED ORDER — OXYCODONE-ACETAMINOPHEN 5-325 MG PO TABS: 1.0000 | ORAL_TABLET | Freq: Three times a day (TID) | ORAL | 0 refills | Status: DC | PRN

## 2017-03-13 MED ORDER — ONDANSETRON HCL 4 MG PO TABS: 4.0000 mg | ORAL_TABLET | Freq: Four times a day (QID) | ORAL | 0 refills | Status: DC | PRN

## 2017-03-13 NOTE — Progress Notes (Signed)
Crystal MoodKiran Marquitta Persichetti MD, MRCP(U.K) [address]  [address]  [place], [place] [address]  Main: [phone]    Crystal MortonHolly Gordon is being followed for Salmonella/E coli gastroenteritis   Subjective: Feels better - still having diarrhea, no fevers, wants to eat    Objective: Vital signs in last 24 hours: Vitals:   03/12/17 1231 03/12/17 1952 03/13/17 0456 03/13/17 0500  BP: 112/61 115/64 (!) 125/55   Pulse: 84 73 90   Resp: 18 18 18    Temp:  97.9 F (36.6 C) 98.1 F (36.7 C)   TempSrc:  Oral Oral   SpO2: 99% 100% 97%   Weight:    209 lb 1.6 oz (94.8 kg)  Height:       Weight change:   Intake/Output Summary (Last 24 hours) at 03/13/17 0815 Last data filed at 03/12/17 1903  Gross per 24 hour  Intake             1140 ml  Output                0 ml  Net             1140 ml     Exam: Heart:: Regular rate and rhythm, S1S2 present or without murmur or extra heart sounds Lungs: normal, clear to auscultation and clear to auscultation and percussion Abdomen: soft, nontender, normal bowel sounds   Lab Results: @LABTEST2 @ Micro Results: Recent Results (from the past 240 hour(s))  Blood Culture (routine x 2)     Status: None (Preliminary result)   Collection Time: 03/10/17 10:39 PM  Result Value Ref Range Status   Specimen Description [facility]  Final   Special Requests   Final    BOTTLES DRAWN AEROBIC AND ANAEROBIC Blood Culture adequate volume   Culture NO GROWTH 3 DAYS  Final   Report Status PENDING  Incomplete  Blood Culture (routine x 2)     Status: None (Preliminary result)   Collection Time: 03/10/17 10:39 PM  Result Value Ref Range Status   Specimen [facility]  Final   Special Requests   Final    BOTTLES DRAWN AEROBIC AND ANAEROBIC Blood Culture adequate volume   Culture NO GROWTH 3 DAYS  Final   Report Status PENDING  Incomplete  Gastrointestinal Panel by PCR , Stool     Status: Abnormal   Collection Time: 03/10/17 10:39 PM  Result Value Ref  Range Status   Campylobacter species NOT DETECTED NOT DETECTED Final   Plesimonas shigelloides NOT DETECTED NOT DETECTED Final   Salmonella species DETECTED (A) NOT DETECTED Final    Comment: RESULT CALLED TO, READ BACK BY AND VERIFIED WITH: Crystal Gordon @ 0204 ON 03/11/2017 BY CAF    Yersinia enterocolitica NOT DETECTED NOT DETECTED Final   Vibrio species NOT DETECTED NOT DETECTED Final   Vibrio cholerae NOT DETECTED NOT DETECTED Final   Enteroaggregative E coli (EAEC) NOT DETECTED NOT DETECTED Final   Enteropathogenic E coli (EPEC) DETECTED (A) NOT DETECTED Final    Comment: RESULT CALLED TO, READ BACK BY AND VERIFIED WITH: Crystal Gordon @ 0204 ON 03/11/2017 BY CAF    Enterotoxigenic E coli (ETEC) NOT DETECTED NOT DETECTED Final   Shiga like toxin producing E coli (STEC) NOT DETECTED NOT DETECTED Final   Shigella/Enteroinvasive E coli (EIEC) NOT DETECTED NOT DETECTED Final   Cryptosporidium NOT DETECTED NOT DETECTED Final   Cyclospora cayetanensis NOT DETECTED NOT DETECTED Final   Entamoeba histolytica NOT DETECTED  NOT DETECTED Final   Giardia lamblia NOT DETECTED NOT DETECTED Final   Adenovirus F40/41 NOT DETECTED NOT DETECTED Final   Astrovirus NOT DETECTED NOT DETECTED Final   Norovirus GI/GII NOT DETECTED NOT DETECTED Final   Rotavirus A NOT DETECTED NOT DETECTED Final   Sapovirus (I, II, IV, and V) NOT DETECTED NOT DETECTED Final  C difficile quick scan w PCR reflex     Status: None   Collection Time: 03/10/17 10:39 PM  Result Value Ref Range Status   C Diff antigen NEGATIVE NEGATIVE Final   C Diff toxin NEGATIVE NEGATIVE Final   C Diff interpretation No C. difficile detected.  Final    Comment: VALID  Stool culture (children & immunocomp patients)     Status: None (Preliminary result)   Collection Time: 03/10/17 10:39 PM  Result Value Ref Range Status   Salmonella/Shigella Screen PENDING  Incomplete   Campylobacter Culture PENDING  Incomplete   E coli, Shiga toxin  Assay Negative Negative Final    Comment: (NOTE) Performed At: [facility] [address] [place], [place] [id number] Mila Homer MD WU:[id number]    Studies/Results: Dg Abd 1 View  Result Date: 03/11/2017 CLINICAL DATA:  Abdominal pain and distension. Diagnosed with colitis 1 day prior. EXAM: ABDOMEN - 1 VIEW COMPARISON:  CT abdomen and pelvis March 10, 2017 FINDINGS: Mild gas distended small bowel to 3 cm, paucity of large bowel gas. No intra-abdominal mass effect or pathologic calcifications. Mildly dense urinary bladder most compatible with contrast excretion. Soft tissue planes and included osseous structures are nonsuspicious. IMPRESSION: Nonspecific bowel gas pattern, paucity of large bowel gas. Electronically Signed   By: Awilda Metro M.D.   On: 03/11/2017 16:56   Medications: I have reviewed the patient's current medications. Scheduled Meds: . ciprofloxacin  500 mg Oral BID  . docusate sodium  100 mg Oral BID  . enoxaparin (LOVENOX) injection  40 mg Subcutaneous Q24H  . mouth rinse  15 mL Mouth Rinse BID  . metroNIDAZOLE  500 mg Oral Q8H  . pantoprazole  40 mg Oral Daily   Continuous Infusions: . sodium chloride 150 mL/hr at 03/13/17 0559   PRN Meds:.acetaminophen **OR** acetaminophen, alum & mag hydroxide-simeth, ketorolac, morphine injection, ondansetron **OR** ondansetron (ZOFRAN) IV   Assessment: Active Problems:   Sepsis (HCC) Crystal Flowersis a 40 y.o.y/o femalewith presented to the hospital with acute salmonella gastroenteritis . Stool PCR is positive for both enteropathogenic ecoli as well as salmonella. . Clinically she is improving   Plan  1.On ciprofloxacin  2. F/u stool for  e coli 0157   3.  She has microcytic iron deficiency anemia- oral iron can be commenced at discharge and needs out patient EGD+colonoscopy   4. Advance diet to soft  I will sign off.  Please call me if any further GI concerns or questions.  We would like to  thank you for the opportunity to participate in the care of Crystal Gordon.      LOS: 2 days   Crystal Mood 03/13/2017, 8:15 AM

## 2017-03-13 NOTE — Discharge Summary (Signed)
[facility] [place] [facility]  Crystal Gordon, [place]40 y.o., DOB 11-09-1975, MRN [id number]. Admission date: 03/10/2017 Discharge Date 03/13/2017 Primary MD Patient, No Pcp Per Admitting Physician Arnaldo NatalMichael S Diamond, MD  Admission Diagnosis  Pancolitis [facility](HCC) [K51.00] Sepsis, due to unspecified organism [facility](HCC) [A41.9]  Discharge Diagnosis   Active Problems:  Sepsis (HCC)  due to acute colitis  Pan Colitis due to salmonella  Microcytic anemia ADHD Bipolar 1 disorder GERD      Hospital Course The patient with past medical history of GERD presents to the emergency department complaining of diarrhea and abdominal pain. She states that her symptoms began 4 days ago and that she has had fever with maximum temperature of 102F. She has had nausea but no vomiting. She denies seeing blood in her stool. She came to the emergency department today due to weakness. CT of her abdomen revealed pancolitis. Patient was admitted and started on antibiotics. Stool cultures did show salmonella. She was seen in consultation by GI as well as infectious disease. Patient's diarrhea is mostly resolved. She is currently afebrile, pain much improved. Diarrhea much less. She is stable for discharge             Consults  GI, ID  Significant Tests:  See full reports for all details     Dg Abd 1 View  Result Date: 03/11/2017 CLINICAL DATA:  Abdominal pain and distension. Diagnosed with colitis 1 day prior. EXAM: ABDOMEN - 1 VIEW COMPARISON:  CT abdomen and pelvis March 10, 2017 FINDINGS: Mild gas distended small bowel to 3 cm, paucity of large bowel gas. No intra-abdominal mass effect or pathologic calcifications. Mildly dense urinary bladder most compatible with contrast excretion. Soft tissue planes and included osseous structures are nonsuspicious. IMPRESSION: Nonspecific bowel gas pattern, paucity of large bowel gas. Electronically Signed   By: Awilda Metroourtnay  Bloomer M.D.   On: 03/11/2017 16:56    Ct Abdomen Pelvis W Contrast  Result Date: 03/11/2017 CLINICAL DATA:  Fever and right lower quadrant pain. Body aches, diarrhea and nausea for 2 days. EXAM: CT ABDOMEN AND PELVIS WITH CONTRAST TECHNIQUE: Multidetector CT imaging of the abdomen and pelvis was performed using the standard protocol following bolus administration of intravenous contrast. CONTRAST:  100mL ISOVUE-300 IOPAMIDOL (ISOVUE-300) INJECTION 61% COMPARISON:  None. FINDINGS: Lower chest: Lung bases are clear. Hepatobiliary: No focal liver abnormality is seen. No gallstones, gallbladder wall thickening, or biliary dilatation. Pancreas: No ductal dilatation or inflammation. Spleen: Normal in size without focal abnormality. Adrenals/Urinary Tract: Adrenal glands are unremarkable. Kidneys are normal, without renal calculi, focal lesion, or hydronephrosis. Symmetric renal excretion on delayed phase imaging per Bladder is unremarkable. Stomach/Bowel: Pan colonic wall thickening and enhancement of the entire colon. Mild pericolonic soft tissue edema involves the cecum and ascending colon. Liquid stool throughout the colon consistent with diarrheal process. The appendix is upper normal in size measuring 7 mm with without surrounding inflammatory change. Small bowel is decompressed without wall thickening or inflammation. Stomach is decompressed. Small hiatal hernia. Vascular/Lymphatic: Enlarged ileocolic lymph nodes largest measuring 11 mm, likely reactive. Small central mesenteric nodes. No retroperitoneal or pelvic adenopathy. Abdominal aorta is normal in caliber. Portal vein appears patent. Reproductive: Uterus and bilateral adnexa are unremarkable. Other: No free air, free fluid, or intra-abdominal fluid collection. Musculoskeletal: There are no acute or suspicious osseous abnormalities. IMPRESSION: 1. Pancolitis, may be infectious or inflammatory. Findings are most pronounced in the cecum and ascending colon. Adjacent ileocolic lymph nodes are  likely reactive. 2.  No evidence of appendicitis. Electronically Signed   By: Rubye Oaks M.D.   On: 03/11/2017 00:06   Dg Chest Port 1 View  Result Date: 03/10/2017 CLINICAL DATA:  41 year old female with fever. EXAM: PORTABLE CHEST 1 VIEW COMPARISON:  Chest radiograph dated 05/29/2016 FINDINGS: The heart size and mediastinal contours are within normal limits. Both lungs are clear. The visualized skeletal structures are unremarkable. IMPRESSION: No active disease. Electronically Signed   By: Elgie Collard M.D.   On: 03/10/2017 23:55       Today   Subjective:   Crystal Gordon  feels better abdominal pain now resolved diarrhea much improve  Objective:   Blood pressure (!) 125/55, pulse 90, temperature 98.1 F (36.7 C), temperature source Oral, resp. rate 18, height 5' 6.5" (1.689 m), weight 209 lb 1.6 oz (94.8 kg), last menstrual period 02/24/2017, SpO2 97 %.  .  Intake/Output Summary (Last 24 hours) at 03/13/17 1448 Last data filed at 03/13/17 0950  Gross per 24 hour  Intake             1740 ml  Output                0 ml  Net             1740 ml    Exam VITAL SIGNS: Blood pressure (!) 125/55, pulse 90, temperature 98.1 F (36.7 C), temperature source Oral, resp. rate 18, height 5' 6.5" (1.689 m), weight 209 lb 1.6 oz (94.8 kg), last menstrual period 02/24/2017, SpO2 97 %.  GENERAL:  41 y.o.-year-old patient lying in the bed with no acute distress.  EYES: Pupils equal, round, reactive to light and accommodation. No scleral icterus. Extraocular muscles intact.  HEENT: Head atraumatic, normocephalic. Oropharynx and nasopharynx clear.  NECK:  Supple, no jugular venous distention. No thyroid enlargement, no tenderness.  LUNGS: Normal breath sounds bilaterally, no wheezing, rales,rhonchi or crepitation. No use of accessory muscles of respiration.  CARDIOVASCULAR: S1, S2 normal. No murmurs, rubs, or gallops.  ABDOMEN: Soft, nontender, nondistended. Bowel sounds present. No  organomegaly or mass.  EXTREMITIES: No pedal edema, cyanosis, or clubbing.  NEUROLOGIC: Cranial nerves II through XII are intact. Muscle strength 5/5 in all extremities. Sensation intact. Gait not checked.  PSYCHIATRIC: The patient is alert and oriented x 3.  SKIN: No obvious rash, lesion, or ulcer.   Data Review     CBC w Diff: Lab Results  Component Value Date   WBC 10.3 03/10/2017   HGB 10.5 (L) 03/10/2017   HCT 33.5 (L) 03/10/2017   PLT 321 03/10/2017   LYMPHOPCT 9 03/10/2017   MONOPCT 9 03/10/2017   EOSPCT 0 03/10/2017   BASOPCT 1 03/10/2017   CMP: Lab Results  Component Value Date   NA 134 (L) 03/10/2017   K 3.6 03/10/2017   CL 98 (L) 03/10/2017   CO2 26 03/10/2017   BUN 8 03/10/2017   CREATININE 0.62 03/12/2017   PROT 7.3 03/10/2017   ALBUMIN 3.5 03/10/2017   BILITOT 0.4 03/10/2017   ALKPHOS 73 03/10/2017   AST 36 03/10/2017   ALT 13 (L) 03/10/2017  .  Micro Results Recent Results (from the past 240 hour(s))  Blood Culture (routine x 2)     Status: None (Preliminary result)   Collection Time: 03/10/17 10:39 PM  Result Value Ref Range Status   Specimen Description BLOOD LT [facility]  Final   Special Requests   Final    BOTTLES DRAWN AEROBIC AND ANAEROBIC Blood Culture  adequate volume   Culture NO GROWTH 3 DAYS  Final   Report Status PENDING  Incomplete  Blood Culture (routine x 2)     Status: None (Preliminary result)   Collection Time: 03/10/17 10:39 PM  Result Value Ref Range Status   Specimen Description BLOOD RT [facility]  Final   Special Requests   Final    BOTTLES DRAWN AEROBIC AND ANAEROBIC Blood Culture adequate volume   Culture NO GROWTH 3 DAYS  Final   Report Status PENDING  Incomplete  Gastrointestinal Panel by PCR , Stool     Status: Abnormal   Collection Time: 03/10/17 10:39 PM  Result Value Ref Range Status   Campylobacter species NOT DETECTED NOT DETECTED Final   Plesimonas shigelloides NOT DETECTED NOT DETECTED Final   Salmonella species DETECTED  (A) NOT DETECTED Final    Comment: RESULT CALLED TO, READ BACK BY AND VERIFIED WITH: REBECCA UHORCHUK @ 0204 ON 03/11/2017 BY CAF    Yersinia enterocolitica NOT DETECTED NOT DETECTED Final   Vibrio species NOT DETECTED NOT DETECTED Final   Vibrio cholerae NOT DETECTED NOT DETECTED Final   Enteroaggregative E coli (EAEC) NOT DETECTED NOT DETECTED Final   Enteropathogenic E coli (EPEC) DETECTED (A) NOT DETECTED Final    Comment: RESULT CALLED TO, READ BACK BY AND VERIFIED WITH: REBECCA UHORCHUK @ 0204 ON 03/11/2017 BY CAF    Enterotoxigenic E coli (ETEC) NOT DETECTED NOT DETECTED Final   Shiga like toxin producing E coli (STEC) NOT DETECTED NOT DETECTED Final   Shigella/Enteroinvasive E coli (EIEC) NOT DETECTED NOT DETECTED Final   Cryptosporidium NOT DETECTED NOT DETECTED Final   Cyclospora cayetanensis NOT DETECTED NOT DETECTED Final   Entamoeba histolytica NOT DETECTED NOT DETECTED Final   Giardia lamblia NOT DETECTED NOT DETECTED Final   Adenovirus F40/41 NOT DETECTED NOT DETECTED Final   Astrovirus NOT DETECTED NOT DETECTED Final   Norovirus GI/GII NOT DETECTED NOT DETECTED Final   Rotavirus A NOT DETECTED NOT DETECTED Final   Sapovirus (I, II, IV, and V) NOT DETECTED NOT DETECTED Final  C difficile quick scan w PCR reflex     Status: None   Collection Time: 03/10/17 10:39 PM  Result Value Ref Range Status   C Diff antigen NEGATIVE NEGATIVE Final   C Diff toxin NEGATIVE NEGATIVE Final   C Diff interpretation No C. difficile detected.  Final    Comment: VALID  Stool culture (children & immunocomp patients)     Status: None (Preliminary result)   Collection Time: 03/10/17 10:39 PM  Result Value Ref Range Status   Salmonella/Shigella Screen Final report  Final    Comment: (NOTE) Performed At: [facility] [address] [place], [place] [id number] Mila Homer MD JX:[id number]    Campylobacter Culture PENDING  Incomplete   E coli, Shiga toxin Assay Negative Negative  Final    Comment: (NOTE) Performed At: [facility] [address] [place], [place] [id number] Mila Homer MD ON:[id number]   STOOL CULTURE REFLEX - RSASHR     Status: None   Collection Time: 03/10/17 10:39 PM  Result Value Ref Range Status   Stool Culture result 1 (RSASHR) Comment  Final    Comment: (NOTE) No Salmonella or Shigella recovered. Performed At: [facility] [address] [place], [place] [id number] Mila Homer MD DG:[id number]         Code Status Orders        Start     Ordered   03/11/17  0981  Full code  Continuous     03/11/17 0328    Code Status History    Date Active Date Inactive Code Status Order ID Comments User Context   This patient has a current code status but no historical code status.          Follow-up Information    pcp Follow up in 1 week(s).        [name], MD Follow up in 4 week(s).   Specialty:  Surgery Why:  microcytic anemia Contact information: [address] [place] [address] [phone]           Discharge Medications   Allergies as of 03/13/2017   No Known Allergies     Medication List    TAKE these medications   ciprofloxacin 500 MG tablet Commonly known as:  CIPRO Take 1 tablet (500 mg total) by mouth 2 (two) times daily.   omeprazole 20 MG capsule Commonly known as:  PRILOSEC Take 20 mg by mouth daily.   ondansetron 4 MG tablet Commonly known as:  ZOFRAN Take 1 tablet (4 mg total) by mouth every 6 (six) hours as needed for nausea.   oxyCODONE-acetaminophen 5-325 MG tablet Commonly known as:  PERCOCET Take 1 tablet by mouth every 8 (eight) hours as needed for severe pain.          Total Time in preparing paper work, data evaluation and todays exam - 35 minutes  Auburn Bilberry M.D on 03/13/2017 at 2:48 PM  [facility]   Office  [phone]

## 2017-03-13 NOTE — Consult Note (Addendum)
Pharmacy Antibiotic Note  Crystal MortonHolly Gordon is a 41 y.o. female admitted on 03/10/2017 with intra abdominal infection- stool positive for ecoli and salmonella.  Pharmacy has been consulted for cipro dosing.  Plan: Continue Ciprofloxacin 500mg  po BID  Height: 5' 6.5" (168.9 cm) Weight: 209 lb 1.6 oz (94.8 kg) IBW/kg (Calculated) : 60.45  Temp (24hrs), Avg:98 F (36.7 C), Min:97.9 F (36.6 C), Max:98.1 F (36.7 C)   Recent Labs Lab 03/10/17 2142 03/11/17 0054 03/12/17 0325  WBC 10.3  --   --   CREATININE 0.92  --  0.62  LATICACIDVEN 3.2* 1.5  --     Estimated Creatinine Clearance: 108.4 mL/min (by C-G formula based on SCr of 0.62 mg/dL).    No Known Allergies  Antimicrobials this admission: zosyn 6/20 >> 6/20 metronidazole 6/20 >>  vanc 6/20>>6/20 cipro 6/20>>  Dose adjustments this admission:  BCx x2 NGTD GI Panel Stool PCR with salmonella and EPEC Cdiff neg  Thank you for allowing pharmacy to be a part of this patient's care.  Crist FatHannah Jaylaa Gordon, PharmD, BCPS Clinical Pharmacist 03/13/2017 12:57 PM

## 2017-03-15 LAB — CULTURE, BLOOD (ROUTINE X 2)
CULTURE: NO GROWTH
Culture: NO GROWTH
Special Requests: ADEQUATE
Special Requests: ADEQUATE

## 2017-03-17 LAB — STOOL CULTURE: E COLI SHIGA TOXIN ASSAY: NEGATIVE

## 2017-03-17 LAB — STOOL CULTURE REFLEX - CMPCXR

## 2017-03-17 LAB — STOOL CULTURE REFLEX - RSASHR

## 2017-04-03 ENCOUNTER — Emergency Department
Admission: EM | Admit: 2017-04-03 | Discharge: 2017-04-03 | Disposition: A | Discharge status: Home / Self Care | Payer: Medicaid Other | Attending: Emergency Medicine | Admitting: Emergency Medicine

## 2017-04-03 ENCOUNTER — Encounter: Payer: Self-pay | Admitting: Emergency Medicine

## 2017-04-03 DIAGNOSIS — Z79899 Other long term (current) drug therapy: Secondary | ICD-10-CM | POA: Insufficient documentation

## 2017-04-03 DIAGNOSIS — Z87891 Personal history of nicotine dependence: Secondary | ICD-10-CM | POA: Insufficient documentation

## 2017-04-03 DIAGNOSIS — R112 Nausea with vomiting, unspecified: Secondary | ICD-10-CM | POA: Insufficient documentation

## 2017-04-03 DIAGNOSIS — R109 Unspecified abdominal pain: Secondary | ICD-10-CM | POA: Diagnosis present

## 2017-04-03 LAB — COMPREHENSIVE METABOLIC PANEL
ALT: 21 U/L (ref 14–54)
ANION GAP: 8 (ref 5–15)
AST: 28 U/L (ref 15–41)
Albumin: 3.7 g/dL (ref 3.5–5.0)
Alkaline Phosphatase: 71 U/L (ref 38–126)
BILIRUBIN TOTAL: 0.7 mg/dL (ref 0.3–1.2)
BUN: 15 mg/dL (ref 6–20)
CHLORIDE: 104 mmol/L (ref 101–111)
CO2: 26 mmol/L (ref 22–32)
Calcium: 8.6 mg/dL — ABNORMAL LOW (ref 8.9–10.3)
Creatinine, Ser: 0.74 mg/dL (ref 0.44–1.00)
Glucose, Bld: 110 mg/dL — ABNORMAL HIGH (ref 65–99)
Potassium: 3.9 mmol/L (ref 3.5–5.1)
Sodium: 138 mmol/L (ref 135–145)
TOTAL PROTEIN: 7.2 g/dL (ref 6.5–8.1)

## 2017-04-03 LAB — URINALYSIS, COMPLETE (UACMP) WITH MICROSCOPIC
BACTERIA UA: NONE SEEN
BILIRUBIN URINE: NEGATIVE
Glucose, UA: NEGATIVE mg/dL
KETONES UR: NEGATIVE mg/dL
LEUKOCYTES UA: NEGATIVE
NITRITE: NEGATIVE
PH: 5 (ref 5.0–8.0)
Protein, ur: NEGATIVE mg/dL
SPECIFIC GRAVITY, URINE: 1.019 (ref 1.005–1.030)

## 2017-04-03 LAB — CBC
HEMATOCRIT: 33.4 % — AB (ref 35.0–47.0)
Hemoglobin: 10.4 g/dL — ABNORMAL LOW (ref 12.0–16.0)
MCH: 21.7 pg — ABNORMAL LOW (ref 26.0–34.0)
MCHC: 31.1 g/dL — ABNORMAL LOW (ref 32.0–36.0)
MCV: 69.8 fL — ABNORMAL LOW (ref 80.0–100.0)
Platelets: 369 10*3/uL (ref 150–440)
RBC: 4.78 MIL/uL (ref 3.80–5.20)
RDW: 20.6 % — AB (ref 11.5–14.5)
WBC: 7.8 10*3/uL (ref 3.6–11.0)

## 2017-04-03 LAB — LIPASE, BLOOD: LIPASE: 36 U/L (ref 11–51)

## 2017-04-03 LAB — POCT PREGNANCY, URINE: PREG TEST UR: NEGATIVE

## 2017-04-03 MED ORDER — ONDANSETRON HCL 4 MG/2ML IJ SOLN: 4.0000 mg | Freq: Once | INTRAMUSCULAR | Status: DC

## 2017-04-03 MED ORDER — SODIUM CHLORIDE 0.9 % IV BOLUS (SEPSIS): 1000.0000 mL | Freq: Once | INTRAVENOUS | Status: DC

## 2017-04-03 MED ORDER — ONDANSETRON 4 MG PO TBDP: 4.0000 mg | ORAL_TABLET | Freq: Three times a day (TID) | ORAL | 0 refills | Status: DC | PRN

## 2017-04-03 NOTE — ED Provider Notes (Signed)
[facility] Emergency Department Provider Note  ____________________________________________  Time seen: Approximately 3:32 PM  I have reviewed the triage vital signs and the nursing notes.   HISTORY  Chief Complaint Abdominal Pain and Emesis   HPI Crystal Gordon is a 41 y.o. female with history of GERD and bipolar disorder who presents for evaluation of nausea and vomiting. Patient reports this morning she had eggs fried on bacon fatand bacon. After that she started feeling nauseated with some burning sensation in her epigastrium and had 8 episodes of nonbloody nonbilious emesis. Her last episode of vomiting was 5 hours ago. She denies right upper quadrant and right lower quadrant abdominal pain, fever or chills, dysuria hematuria, diarrhea or constipation. She reports that her pain initially was mild to moderate located in her epigastrium, burning quality, nonradiating however that has resolved. She reports that he felt like gas pain.  Past Medical History:  Diagnosis Date  . ADHD (attention deficit hyperactivity disorder)   . Bipolar 1 disorder (HCC)   . GERD (gastroesophageal reflux disease)     Patient Active Problem List   Diagnosis Date Noted  . Sepsis (HCC) 03/11/2017    Past Surgical History:  Procedure Laterality Date  . EYE SURGERY      Prior to Admission medications   Medication Sig Start Date End Date Taking? Authorizing Provider  omeprazole (PRILOSEC) 20 MG capsule Take 20 mg by mouth daily.    [provider]  ondansetron (ZOFRAN ODT) 4 MG disintegrating tablet Take 1 tablet (4 mg total) by mouth every 8 (eight) hours as needed for nausea or vomiting. 04/03/17   Don Perking, [place], MD  ondansetron (ZOFRAN) 4 MG tablet Take 1 tablet (4 mg total) by mouth every 6 (six) hours as needed for nausea. Patient not taking: Reported on 04/03/2017 03/13/17   Auburn Bilberry, MD  oxyCODONE-acetaminophen (PERCOCET) 5-325 MG tablet Take 1  tablet by mouth every 8 (eight) hours as needed for severe pain. Patient not taking: Reported on 04/03/2017 03/13/17   Auburn Bilberry, MD    Allergies Patient has no known allergies.  Family History  Problem Relation Age of Onset  . Diabetes Mellitus II Paternal Grandmother     Social History Social History  Substance Use Topics  . Smoking status: Former Games developer  . Smokeless tobacco: Never Used  . Alcohol use Yes     Comment: rarely    Review of Systems  Constitutional: Negative for fever. Eyes: Negative for visual changes. ENT: Negative for sore throat. Neck: No neck pain  Cardiovascular: Negative for chest pain. Respiratory: Negative for shortness of breath. Gastrointestinal: Negative for abdominal pain, diarrhea. + N/V Genitourinary: Negative for dysuria. Musculoskeletal: Negative for back pain. Skin: Negative for rash. Neurological: Negative for headaches, weakness or numbness. Psych: No SI or HI  ____________________________________________   PHYSICAL EXAM:  VITAL SIGNS: ED Triage Vitals  Enc Vitals Group     BP 04/03/17 1158 131/76     Pulse Rate 04/03/17 1158 (!) 105     Resp 04/03/17 1158 16     Temp 04/03/17 1158 98.2 F (36.8 C)     Temp Source 04/03/17 1158 Oral     SpO2 04/03/17 1158 98 %     Weight 04/03/17 1156 209 lb (94.8 kg)     Height 04/03/17 1156 5' 6.5" (1.689 m)     Head Circumference --      Peak Flow --      Pain Score 04/03/17 1156 1  Pain Loc --      Pain Edu? --      Excl. in GC? --     Constitutional: Alert and oriented. Well appearing and in no apparent distress. HEENT:      Head: Normocephalic and atraumatic.         Eyes: Conjunctivae are normal. Sclera is non-icteric.       Mouth/Throat: Mucous membranes are moist.       Neck: Supple with no signs of meningismus. Cardiovascular: Regular rate and rhythm. No murmurs, gallops, or rubs. 2+ symmetrical distal pulses are present in all extremities. No JVD. Respiratory:  Normal respiratory effort. Lungs are clear to auscultation bilaterally. No wheezes, crackles, or rhonchi.  Gastrointestinal: Soft, non tender, and non distended with positive bowel sounds. No rebound or guarding. Musculoskeletal: Nontender with normal range of motion in all extremities. No edema, cyanosis, or erythema of extremities. Neurologic: Normal speech and language. Face is symmetric. Moving all extremities. No gross focal neurologic deficits are appreciated. Skin: Skin is warm, dry and intact. No rash noted. Psychiatric: Mood and affect are normal. Speech and behavior are normal.  ____________________________________________   LABS (all labs ordered are listed, but only abnormal results are displayed)  Labs Reviewed  COMPREHENSIVE METABOLIC PANEL - Abnormal; Notable for the following:       Result Value   Glucose, Bld 110 (*)    Calcium 8.6 (*)    All other components within normal limits  CBC - Abnormal; Notable for the following:    Hemoglobin 10.4 (*)    HCT 33.4 (*)    MCV 69.8 (*)    MCH 21.7 (*)    MCHC 31.1 (*)    RDW 20.6 (*)    All other components within normal limits  URINALYSIS, COMPLETE (UACMP) WITH MICROSCOPIC - Abnormal; Notable for the following:    Color, Urine YELLOW (*)    APPearance CLEAR (*)    Hgb urine dipstick MODERATE (*)    Squamous Epithelial / LPF 6-30 (*)    All other components within normal limits  LIPASE, BLOOD  POCT PREGNANCY, URINE   ____________________________________________  EKG  none  ____________________________________________  RADIOLOGY  none  ____________________________________________   PROCEDURES  Procedure(s) performed: None Procedures Critical Care performed:  None ____________________________________________   INITIAL IMPRESSION / ASSESSMENT AND PLAN / ED COURSE  41 y.o. female with history of GERD and bipolar disorder who presents for evaluation of nausea and vomiting after eating eggs and bacon  earlier today. Patient is extremely well appearing, no distress, she has normal vital signs, her abdomen is soft with no tenderness throughout. Blood work showing negative pregnancy test, UA with small amount of blood (patient's menses ended today), normal CMP, normal lipase, normal CBC. Presentation concerning for gastritis versus food poisoning. I offered to put an IV and give patient Zofran and IV fluids however patient says that she hasn't vomited after eating Oreos a few hours ago in the waiting room and she is feeling much better and therefore she is requesting to be discharged at this time. Patient be discharged home with Zofran. Discussed return precautions if she develops fever, abdominal pain especially in the right upper or lower quadrants, urinary symptoms, or any new symptoms that were not present during this visit. Otherwise recommend she follows up with her PCP in 24-48 hours.     Pertinent labs & imaging results that were available during my care of the patient were reviewed by me and considered in my medical  decision making (see chart for details).    ____________________________________________   FINAL CLINICAL IMPRESSION(S) / ED DIAGNOSES  Final diagnoses:  Non-intractable vomiting with nausea, unspecified vomiting type      NEW MEDICATIONS STARTED DURING THIS VISIT:  New Prescriptions   ONDANSETRON (ZOFRAN ODT) 4 MG DISINTEGRATING TABLET    Take 1 tablet (4 mg total) by mouth every 8 (eight) hours as needed for nausea or vomiting.     Note:  This document was prepared using Dragon voice recognition software and may include unintentional dictation errors.    Don PerkingVeronese, [place], MD 04/03/17 1536

## 2017-04-03 NOTE — ED Triage Notes (Signed)
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  Today had bacon and eggs for breakfast, and since that time has vomited 8 times.  Now only c/o occasional "gas pressure" to mid abdomen.

## 2017-04-09 ENCOUNTER — Ambulatory Visit: Payer: Self-pay | Admitting: Gastroenterology

## 2017-04-11 ENCOUNTER — Encounter: Payer: Self-pay | Admitting: Medical Oncology

## 2017-04-11 ENCOUNTER — Emergency Department
Admission: EM | Admit: 2017-04-11 | Discharge: 2017-04-11 | Disposition: A | Discharge status: Home / Self Care | Payer: Medicaid Other | Attending: Emergency Medicine | Admitting: Emergency Medicine

## 2017-04-11 DIAGNOSIS — Z87891 Personal history of nicotine dependence: Secondary | ICD-10-CM | POA: Diagnosis not present

## 2017-04-11 DIAGNOSIS — R102 Pelvic and perineal pain: Secondary | ICD-10-CM | POA: Diagnosis present

## 2017-04-11 DIAGNOSIS — L0231 Cutaneous abscess of buttock: Secondary | ICD-10-CM | POA: Diagnosis not present

## 2017-04-11 MED ORDER — LIDOCAINE HCL (PF) 1 % IJ SOLN
5.0000 mL | Freq: Once | INTRAMUSCULAR | Status: AC
  Administered 2017-04-11: 5 mL
  Filled 2017-04-11: qty 5

## 2017-04-11 MED ORDER — OXYCODONE-ACETAMINOPHEN 5-325 MG PO TABS
1.0000 | ORAL_TABLET | Freq: Once | ORAL | Status: AC
  Administered 2017-04-11: 1 via ORAL
  Filled 2017-04-11: qty 1

## 2017-04-11 MED ORDER — SULFAMETHOXAZOLE-TRIMETHOPRIM 800-160 MG PO TABS
1.0000 | ORAL_TABLET | Freq: Once | ORAL | Status: AC
  Administered 2017-04-11: 1 via ORAL
  Filled 2017-04-11: qty 1

## 2017-04-11 MED ORDER — PENTAFLUOROPROP-TETRAFLUOROETH EX AERO
1.0000 "application " | INHALATION_SPRAY | CUTANEOUS | Status: DC | PRN
  Administered 2017-04-11: 1 via TOPICAL

## 2017-04-11 MED ORDER — SULFAMETHOXAZOLE-TRIMETHOPRIM 800-160 MG PO TABS: 1.0000 | ORAL_TABLET | Freq: Two times a day (BID) | ORAL | 0 refills | Status: DC

## 2017-04-11 MED ORDER — HYDROCODONE-ACETAMINOPHEN 5-325 MG PO TABS: 1.0000 | ORAL_TABLET | Freq: Four times a day (QID) | ORAL | 0 refills | Status: DC | PRN

## 2017-04-11 NOTE — ED Triage Notes (Signed)
Pt reports that she had intercourse Wednesday night in the shower and since then has been having vaginal swelling and pain. Denies bleeding/discharge.

## 2017-04-11 NOTE — Discharge Instructions (Signed)
Keep the wound clean, dry, and covered. Apply warm compresses to promote healing. Take the antibiotic as directed and the pain medicine as needed. Return to the ED in 2-3 days for packing removal.

## 2017-04-11 NOTE — ED Provider Notes (Signed)
[facility] Emergency Department Provider Note ____________________________________________  Time seen: 1735  I have reviewed the triage vital signs and the nursing notes.  HISTORY  Chief Complaint  Groin Swelling  HPI Crystal Gordon is a 41 y.o. female presents to the ED for evaluation of vulvar pain and swelling. Patient denies any significant bleeding or vaginal discharge. She does admit to having intercourse one night in the shower, and has reported irritation since that time.She reports increasing pain and pressure to the right buttock in the groin.  Past Medical History:  Diagnosis Date  . ADHD (attention deficit hyperactivity disorder)   . Bipolar 1 disorder (HCC)   . GERD (gastroesophageal reflux disease)     Patient Active Problem List   Diagnosis Date Noted  . Sepsis (HCC) 03/11/2017    Past Surgical History:  Procedure Laterality Date  . EYE SURGERY      Prior to Admission medications   Medication Sig Start Date End Date Taking? Authorizing Provider  HYDROcodone-acetaminophen (NORCO) 5-325 MG tablet Take 1 tablet by mouth every 6 (six) hours as needed. 04/11/17   Laniesha Das, Charlesetta IvoryJenise V Bacon, PA-C  omeprazole (PRILOSEC) 20 MG capsule Take 20 mg by mouth daily.    [provider]  ondansetron (ZOFRAN ODT) 4 MG disintegrating tablet Take 1 tablet (4 mg total) by mouth every 8 (eight) hours as needed for nausea or vomiting. 04/03/17   Don PerkingVeronese, [place], MD  ondansetron (ZOFRAN) 4 MG tablet Take 1 tablet (4 mg total) by mouth every 6 (six) hours as needed for nausea. Patient not taking: Reported on 04/03/2017 03/13/17   Auburn BilberryPatel, Shreyang, MD  oxyCODONE-acetaminophen (PERCOCET) 5-325 MG tablet Take 1 tablet by mouth every 8 (eight) hours as needed for severe pain. Patient not taking: Reported on 04/03/2017 03/13/17   Auburn BilberryPatel, Shreyang, MD  sulfamethoxazole-trimethoprim (BACTRIM DS,SEPTRA DS) 800-160 MG tablet Take 1 tablet by mouth 2 (two) times daily.  04/11/17   Calbert Hulsebus, Charlesetta IvoryJenise V Bacon, PA-C    Allergies Patient has no known allergies.  Family History  Problem Relation Age of Onset  . Diabetes Mellitus II Paternal Grandmother     Social History Social History  Substance Use Topics  . Smoking status: Former Games developermoker  . Smokeless tobacco: Never Used  . Alcohol use Yes     Comment: rarely    Review of Systems  Constitutional: Negative for fever. Cardiovascular: Negative for chest pain. Respiratory: Negative for shortness of breath. Gastrointestinal: Negative for abdominal pain, vomiting and diarrhea. Genitourinary: Negative for dysuria. Vulvar irritation as above. Skin: Negative for rash. ____________________________________________  PHYSICAL EXAM:  VITAL SIGNS: ED Triage Vitals  Enc Vitals Group     BP 04/11/17 1631 (!) 107/56     Pulse Rate 04/11/17 1631 99     Resp 04/11/17 1631 18     Temp 04/11/17 1631 98.6 F (37 C)     Temp Source 04/11/17 1631 Oral     SpO2 04/11/17 1631 98 %     Weight 04/11/17 1632 209 lb (94.8 kg)     Height 04/11/17 1632 5\' 6"  (1.676 m)     Head Circumference --      Peak Flow --      Pain Score 04/11/17 1631 8     Pain Loc --      Pain Edu? --      Excl. in GC? --     Constitutional: Alert and oriented. Well appearing and in no distress. Head: Normocephalic and atraumatic. Hematological/Lymphatic/Immunological: No  cervical lymphadenopathy. Cardiovascular: Normal rate, regular rhythm. Normal distal pulses. Respiratory: Normal respiratory effort. No wheezes/rales/rhonchi. Gastrointestinal: Soft and nontender. No distention. GU: Normal external genitalia. Firm, palpable cystic formation to the right proximal buttock at the perineum.  Neurologic:  Normal gait without ataxia. Normal speech and language. No gross focal neurologic deficits are appreciated. Skin:  Skin is warm, dry and intact. No rash noted. ____________________________________________  PROCEDURES  Bactrim DS 1  PO Percocet 5-325 mg PO  INCISION AND DRAINAGE Performed by: Lissa Hoard Consent: Verbal consent obtained. Risks and benefits: risks, benefits and alternatives were discussed Type: abscess  Body area: right buttock  Anesthesia: local infiltration  Incision was made with a scalpel.  Local anesthetic: lidocaine 1% w/o epinephrine  Anesthetic total: 5 ml  Complexity: complex Blunt dissection to break up loculations  Drainage: purulent  Drainage amount: 10-15 ml  Packing material: 1/4 in iodoform gauze  Patient tolerance: Patient tolerated the procedure well with no immediate complications. ____________________________________________  INITIAL IMPRESSION / ASSESSMENT AND PLAN / ED COURSE  Patient with a right buttocks abscess on presentation. She is status post I&D procedure. She is discharged with a prescription for Norco and Bactrim DS. She will follow-up with her primary provider or this ED in 3-days for wound check.  ____________________________________________  FINAL CLINICAL IMPRESSION(S) / ED DIAGNOSES  Final diagnoses:  Abscess of buttock, right      Karmen Stabs, Charlesetta Ivory, PA-C 04/11/17 1939    Sharyn Creamer, MD 04/11/17 2117

## 2017-05-21 ENCOUNTER — Encounter: Payer: Self-pay | Admitting: Emergency Medicine

## 2017-05-21 ENCOUNTER — Emergency Department
Admission: EM | Admit: 2017-05-21 | Discharge: 2017-05-21 | Disposition: A | Discharge status: Home / Self Care | Payer: Medicaid Other | Attending: Emergency Medicine | Admitting: Emergency Medicine

## 2017-05-21 DIAGNOSIS — F319 Bipolar disorder, unspecified: Secondary | ICD-10-CM | POA: Insufficient documentation

## 2017-05-21 DIAGNOSIS — F909 Attention-deficit hyperactivity disorder, unspecified type: Secondary | ICD-10-CM | POA: Diagnosis not present

## 2017-05-21 DIAGNOSIS — Z79899 Other long term (current) drug therapy: Secondary | ICD-10-CM | POA: Insufficient documentation

## 2017-05-21 DIAGNOSIS — L0291 Cutaneous abscess, unspecified: Secondary | ICD-10-CM | POA: Diagnosis not present

## 2017-05-21 DIAGNOSIS — Z87891 Personal history of nicotine dependence: Secondary | ICD-10-CM | POA: Insufficient documentation

## 2017-05-21 MED ORDER — OXYCODONE-ACETAMINOPHEN 5-325 MG PO TABS: 1.0000 | ORAL_TABLET | Freq: Four times a day (QID) | ORAL | 0 refills | Status: AC | PRN

## 2017-05-21 MED ORDER — SULFAMETHOXAZOLE-TRIMETHOPRIM 800-160 MG PO TABS: 1.0000 | ORAL_TABLET | Freq: Two times a day (BID) | ORAL | 0 refills | Status: AC

## 2017-05-21 NOTE — Discharge Instructions (Signed)
Begin taking Bactrim DS twice a day for 10 days and Percocet one every 6 hours as needed for pain. While taking pain medication. cannot drive or operate machinery. Do warm compresses or sit in a tub of water that is warm often. If area becomes soft or increases in size return to the emergency room. Area will need to be drained if it does not spontaneously open on its on.

## 2017-05-21 NOTE — ED Triage Notes (Signed)
Pt comes into the ED via POV c/o vaginal abscess.  Patient states she has a h/o of these abscesses.  Denies any fevers at this time.  Patient in NAD at this time with even and unlabored respirations and was ambulatory to triage.

## 2017-05-21 NOTE — ED Provider Notes (Signed)
[facility] Emergency Department Provider Note  ___________________________________________   First MD Initiated Contact with Patient 05/21/17 1129     (approximate)  I have reviewed the triage vital signs and the nursing notes.   HISTORY  Chief Complaint Abscess   HPI Crystal Gordon is a 41 y.o. female is here with complaint of abscess that began possibly a day and half ago. Patient states she has not had any nausea, vomiting, or fever. She states that she had one previous abscess that needed to be lanced. Patient states that the area is very hard but tender to touch. There is been no drainage. Currently she rates her pain as a 3/10.   Past Medical History:  Diagnosis Date  . ADHD (attention deficit hyperactivity disorder)   . Bipolar 1 disorder (HCC)   . GERD (gastroesophageal reflux disease)     Patient Active Problem List   Diagnosis Date Noted  . Sepsis (HCC) 03/11/2017    Past Surgical History:  Procedure Laterality Date  . EYE SURGERY      Prior to Admission medications   Medication Sig Start Date End Date Taking? Authorizing Provider  omeprazole (PRILOSEC) 20 MG capsule Take 20 mg by mouth daily.    [provider]  oxyCODONE-acetaminophen (PERCOCET) 5-325 MG tablet Take 1 tablet by mouth every 6 (six) hours as needed for severe pain. 05/21/17   Tommi Rumps, PA-C  sulfamethoxazole-trimethoprim (BACTRIM DS,SEPTRA DS) 800-160 MG tablet Take 1 tablet by mouth 2 (two) times daily. 05/21/17   Tommi Rumps, PA-C    Allergies Patient has no known allergies.  Family History  Problem Relation Age of Onset  . Diabetes Mellitus II Paternal Grandmother     Social History Social History  Substance Use Topics  . Smoking status: Former Games developer  . Smokeless tobacco: Never Used  . Alcohol use Yes     Comment: rarely    Review of Systems Constitutional: No fever/chills Cardiovascular: Denies chest pain. Respiratory:  Denies shortness of breath. Genitourinary: Negative for dysuria.Negative for vaginal discharge. Musculoskeletal: Negative for back pain. Skin: Possible abscess Neurological: Negative for headaches ____________________________________________   PHYSICAL EXAM:  VITAL SIGNS: ED Triage Vitals  Enc Vitals Group     BP 05/21/17 1111 134/68     Pulse Rate 05/21/17 1111 100     Resp 05/21/17 1111 17     Temp 05/21/17 1111 98.1 F (36.7 C)     Temp Source 05/21/17 1111 Oral     SpO2 05/21/17 1111 100 %     Weight 05/21/17 1109 209 lb (94.8 kg)     Height 05/21/17 1109 5\' 6"  (1.676 m)     Head Circumference --      Peak Flow --      Pain Score 05/21/17 1108 3     Pain Loc --      Pain Edu? --      Excl. in GC? --    Constitutional: Alert and oriented. Well appearing and in no acute distress. Eyes: Conjunctivae are normal. Head: Atraumatic. Neck: No stridor.   Cardiovascular: Normal rate, regular rhythm. Grossly normal heart sounds.  Good peripheral circulation. Respiratory: Normal respiratory effort.  No retractions. Lungs CTAB. Musculoskeletal: No lower extremity tenderness nor edema.  No joint effusions. Neurologic:  Normal speech and language. No gross focal neurologic deficits are appreciated.  Skin:  Skin is warm, dry and intact. Inner upper thigh just lateral to the labia majora there is a non-erythematous firm  nodule that is tender to palpation. Area feels cystic and most likely is an early abscess. Psychiatric: Mood and affect are normal. Speech and behavior are normal.  ____________________________________________   LABS (all labs ordered are listed, but only abnormal results are displayed)  Labs Reviewed - No data to display   PROCEDURES  Procedure(s) performed: None  Procedures  Critical Care performed: No  ____________________________________________   INITIAL IMPRESSION / ASSESSMENT AND PLAN / ED COURSE  Pertinent labs & imaging results that were  available during my care of the patient were reviewed by me and considered in my medical decision making (see chart for details).  Patient is here with possible abscess. We discussed treatment plan. Patient is agreeable to begin on antibiotics and also something for pain. She'll try sitz baths or warm compresses frequently. She'll return to the emergency room for incision and drainage when area is soften.  Patient was discharged on Percocet as needed for pain and Bactrim DS twice a day for 10 days.   ____________________________________________   FINAL CLINICAL IMPRESSION(S) / ED DIAGNOSES  Final diagnoses:  Abscess      NEW MEDICATIONS STARTED DURING THIS VISIT:  Discharge Medication List as of 05/21/2017  1:08 PM       Note:  This document was prepared using Dragon voice recognition software and may include unintentional dictation errors.    Tommi RumpsSummers, Rhonda L, PA-C 05/21/17 1749    Arnaldo NatalMalinda, Paul F, MD 05/22/17 [phone]

## 2017-05-21 NOTE — ED Notes (Signed)
See triage note  States she noticed a possible abscess to vaginal area  Hx of same in past

## 2018-06-11 IMAGING — CT CT ABD-PELV W/ CM
2 of 5 series · 16 of 46 positions shown, 18 images · IV contrast (APPLIED)
Comparison: None.

CLINICAL DATA: Fever and right lower quadrant pain. Body aches,
diarrhea and nausea for 2 days.

EXAM:
CT ABDOMEN AND PELVIS WITH CONTRAST
TECHNIQUE: Multidetector CT imaging of the abdomen and pelvis was performed
using the standard protocol following bolus administration of
intravenous contrast.
CONTRAST:  100mL RKBBL0-PAA IOPAMIDOL (RKBBL0-PAA) INJECTION 61%

[Series 2: routine abd/pel with · axial · 0.88mm/px · z∈[-1023,-598]mm · 13 of 97 slices shown, 15 images]
[im 6/97  soft-tissue]
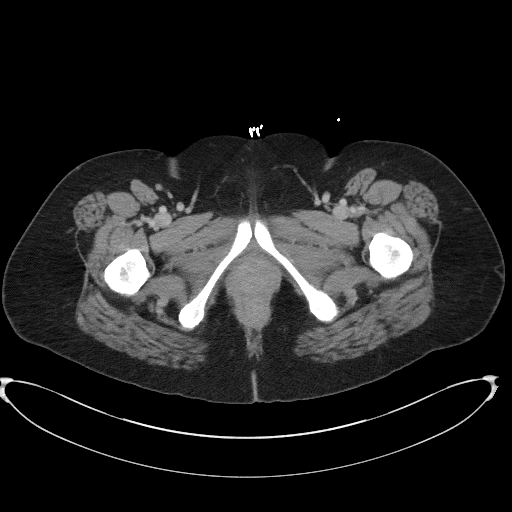
[im 6/97  bone]
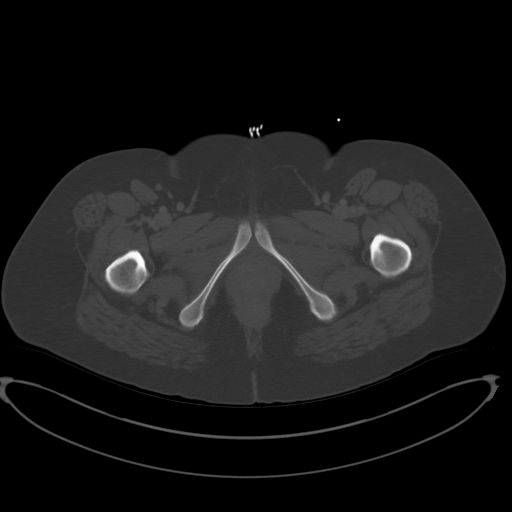
[im 16/97  soft-tissue]
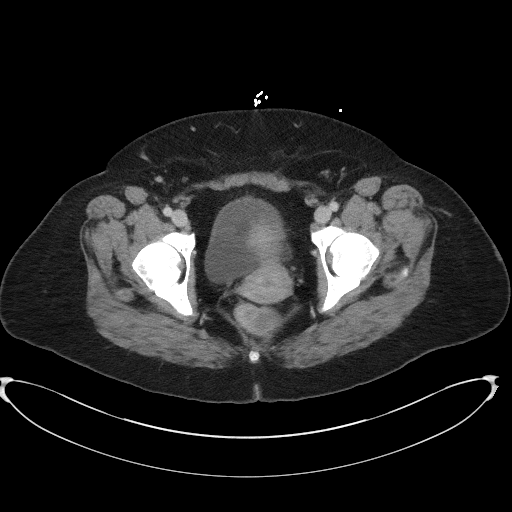
[im 21/97  soft-tissue]
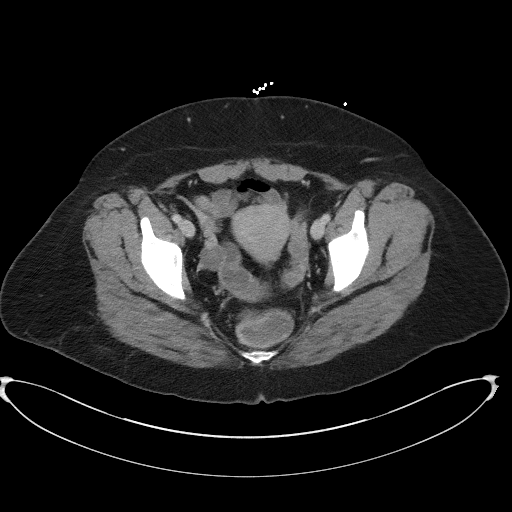
[im 26/97  soft-tissue]
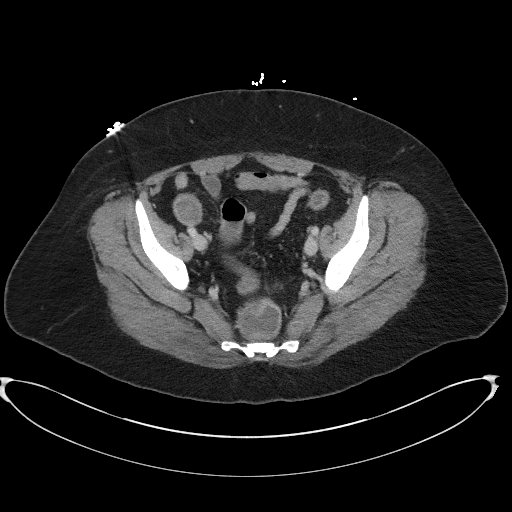
[im 36/97  soft-tissue]
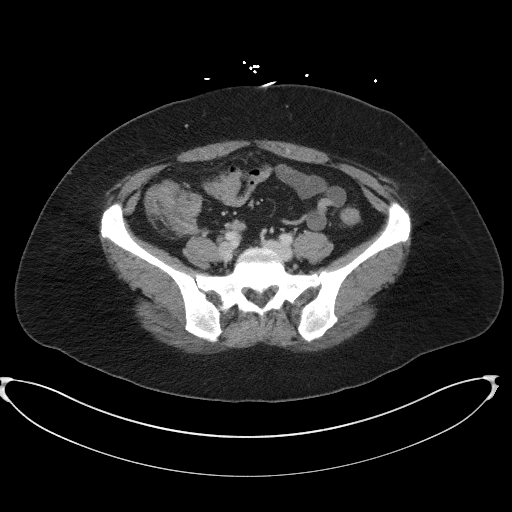
[im 41/97  soft-tissue]
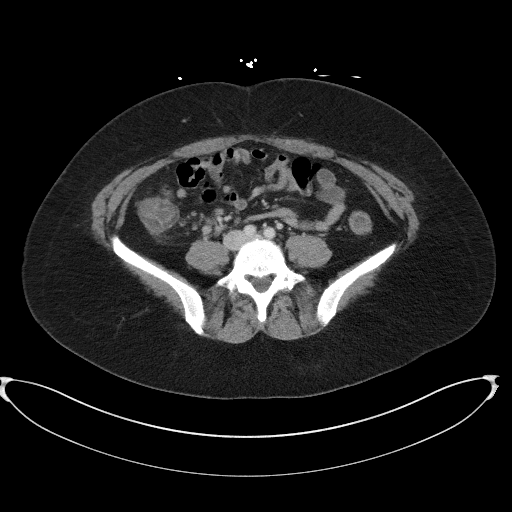
[im 51/97  soft-tissue]
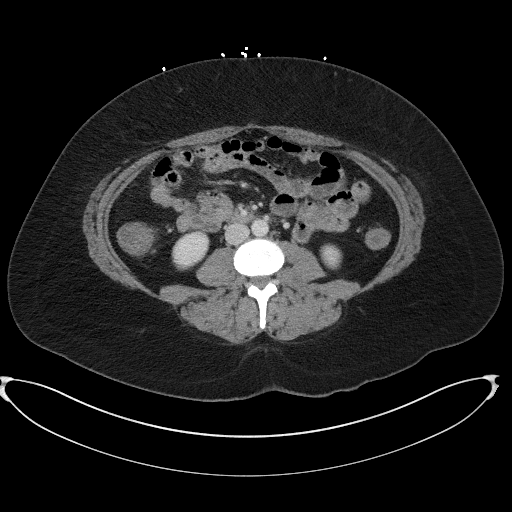
[im 56/97  soft-tissue]
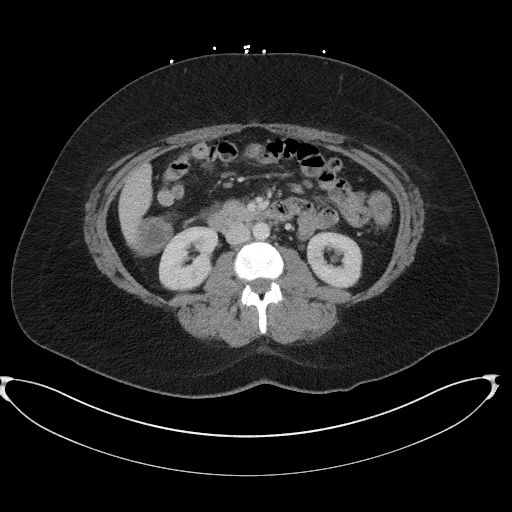
[im 61/97  soft-tissue]
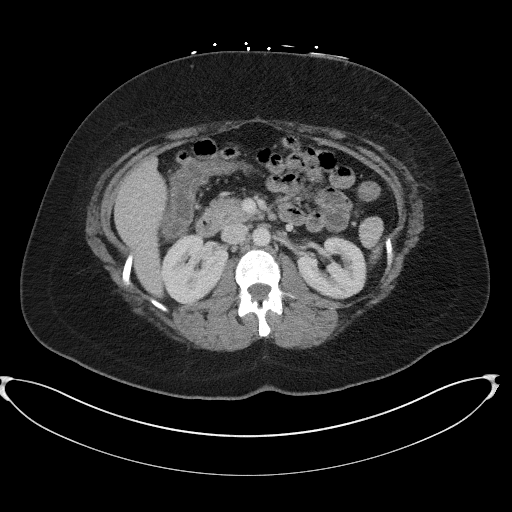
[im 61/97  bone]
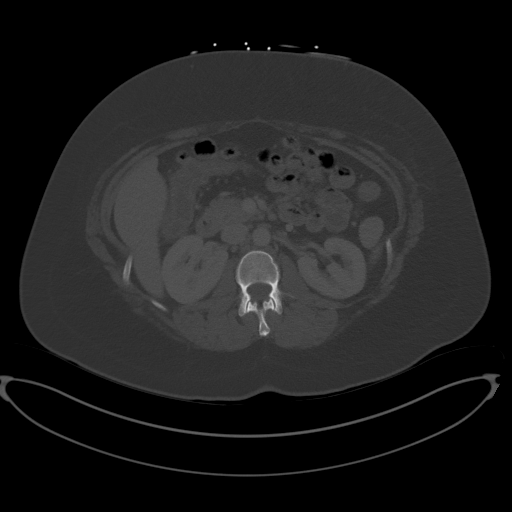
[im 71/97  soft-tissue]
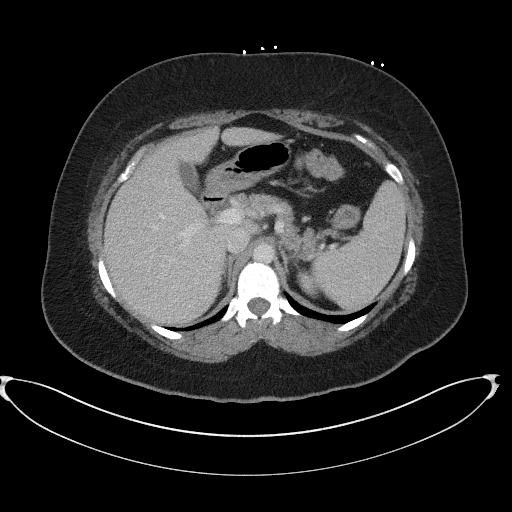
[im 76/97  soft-tissue]
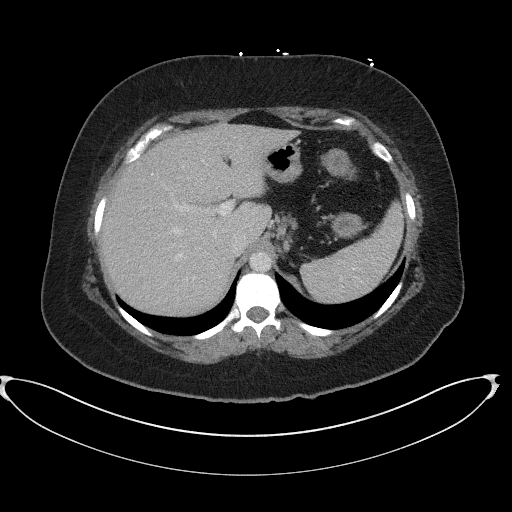
[im 81/97  soft-tissue]
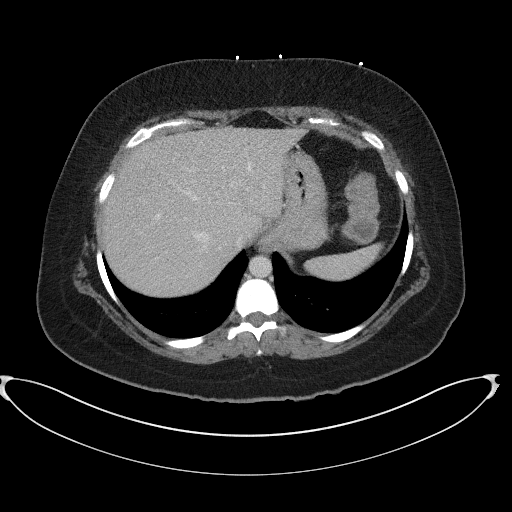
[im 91/97  soft-tissue]
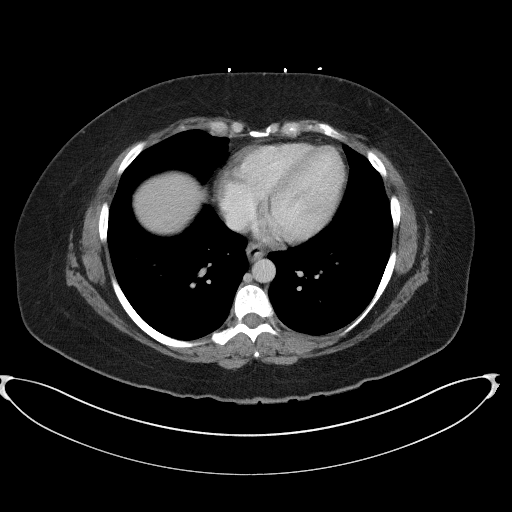

[Series 5: coronal st · coronal · 0.84mm/px · 3 of 98 slices shown]
[im 33/98  soft-tissue]
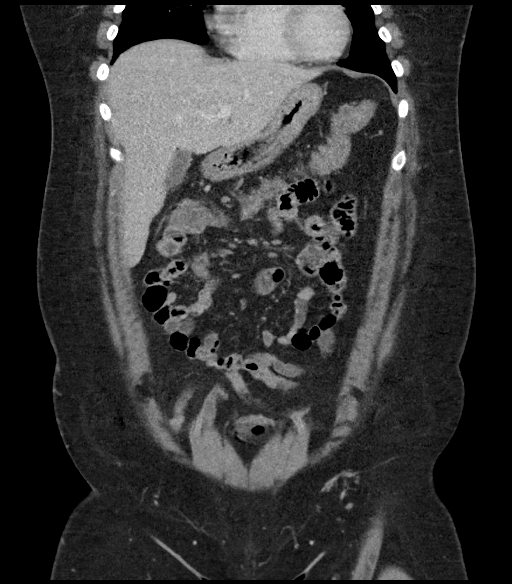
[im 44/98  soft-tissue]
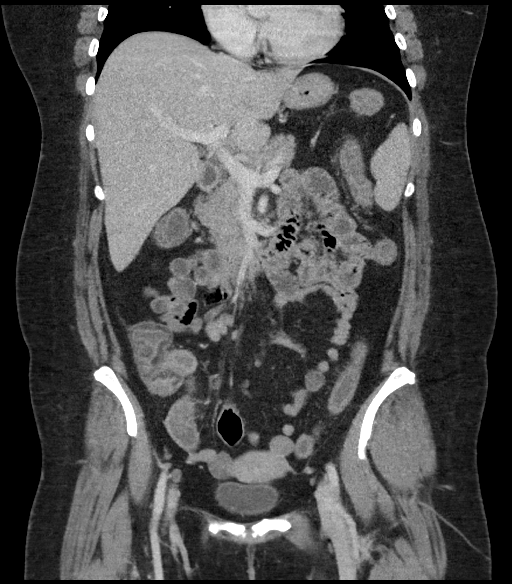
[im 54/98  soft-tissue]
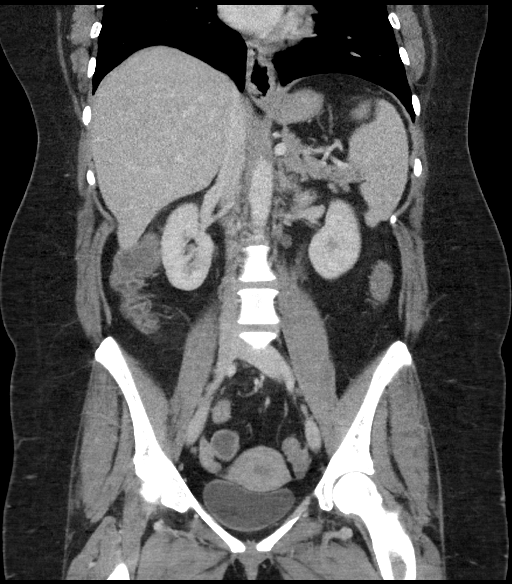

[16 of 46 positions shown; findings below may reference images not displayed]

FINDINGS: Lower chest: Lung bases are clear.

Hepatobiliary: No focal liver abnormality is seen. No gallstones,
gallbladder wall thickening, or biliary dilatation.

Pancreas: No ductal dilatation or inflammation.

Spleen: Normal in size without focal abnormality.

Adrenals/Urinary Tract: Adrenal glands are unremarkable. Kidneys are
normal, without renal calculi, focal lesion, or hydronephrosis.
Symmetric renal excretion on delayed phase imaging per Bladder is
unremarkable.

Stomach/Bowel: Pan colonic wall thickening and enhancement of the
entire colon. Mild pericolonic soft tissue edema involves the cecum
and ascending colon. Liquid stool throughout the colon consistent
with diarrheal process. The appendix is upper normal in size
measuring 7 mm with without surrounding inflammatory change. Small
bowel is decompressed without wall thickening or inflammation.
Stomach is decompressed. Small hiatal hernia.

Vascular/Lymphatic: Enlarged ileocolic lymph nodes largest measuring
11 mm, likely reactive. Small central mesenteric nodes. No
retroperitoneal or pelvic adenopathy. Abdominal aorta is normal in
caliber. Portal vein appears patent.

Reproductive: Uterus and bilateral adnexa are unremarkable.

Other: No free air, free fluid, or intra-abdominal fluid collection.

Musculoskeletal: There are no acute or suspicious osseous
abnormalities.
IMPRESSION: 1. Pancolitis, may be infectious or inflammatory. Findings are most
pronounced in the cecum and ascending colon. Adjacent ileocolic
lymph nodes are likely reactive.
2. No evidence of appendicitis.

## 2018-06-12 IMAGING — DX DG ABDOMEN 1V
1 series · 1 of 1 positions shown · non-contrast
Comparison: CT abdomen and pelvis March 10, 2017

CLINICAL DATA: Abdominal pain and distension. Diagnosed with
colitis 1 day prior.

EXAM:
ABDOMEN - 1 VIEW

[abdomen kub]
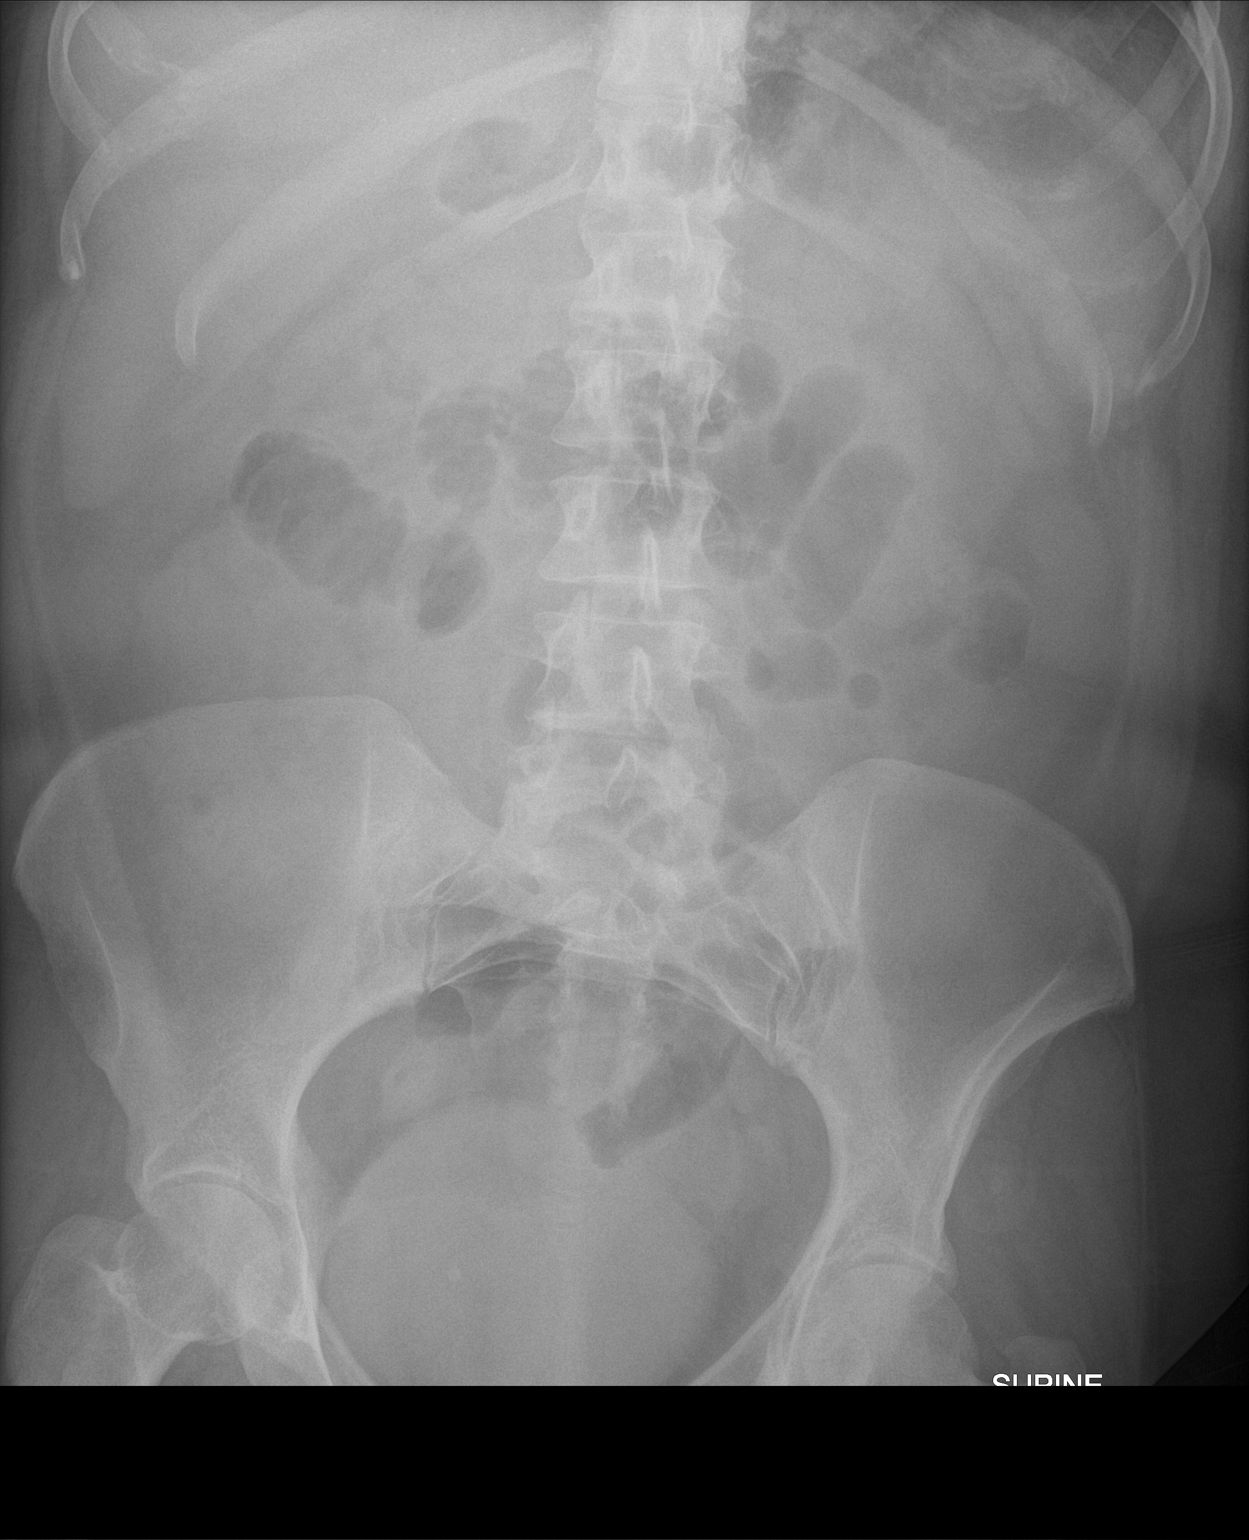

[1 of 1 positions shown; findings below may reference images not displayed]

FINDINGS: Mild gas distended small bowel to 3 cm, paucity of large bowel gas.
No intra-abdominal mass effect or pathologic calcifications. Mildly
dense urinary bladder most compatible with contrast excretion. Soft
tissue planes and included osseous structures are nonsuspicious.
IMPRESSION: Nonspecific bowel gas pattern, paucity of large bowel gas.
